# Patient Record
Sex: Female | Born: 1960 | Race: White | Hispanic: No | Marital: Married | Smoking: Never smoker
Health system: Southern US, Community
[De-identification: ages and names within clinical notes are randomized; demographics above are authoritative.]

## PROBLEM LIST (undated history)

## (undated) DIAGNOSIS — H409 Unspecified glaucoma: Secondary | ICD-10-CM

## (undated) DIAGNOSIS — R519 Headache, unspecified: Secondary | ICD-10-CM

## (undated) DIAGNOSIS — T7840XA Allergy, unspecified, initial encounter: Secondary | ICD-10-CM

## (undated) DIAGNOSIS — K5790 Diverticulosis of intestine, part unspecified, without perforation or abscess without bleeding: Secondary | ICD-10-CM

## (undated) DIAGNOSIS — I10 Essential (primary) hypertension: Secondary | ICD-10-CM

## (undated) DIAGNOSIS — C4490 Unspecified malignant neoplasm of skin, unspecified: Secondary | ICD-10-CM

## (undated) DIAGNOSIS — E669 Obesity, unspecified: Secondary | ICD-10-CM

## (undated) DIAGNOSIS — K259 Gastric ulcer, unspecified as acute or chronic, without hemorrhage or perforation: Secondary | ICD-10-CM

## (undated) HISTORY — DX: Headache, unspecified: R51.9

## (undated) HISTORY — PX: VAGINAL HYSTERECTOMY: SUR661

## (undated) HISTORY — DX: Obesity, unspecified: E66.9

## (undated) HISTORY — PX: KNEE SURGERY: SHX244

## (undated) HISTORY — DX: Unspecified malignant neoplasm of skin, unspecified: C44.90

## (undated) HISTORY — DX: Unspecified glaucoma: H40.9

## (undated) HISTORY — DX: Essential (primary) hypertension: I10

## (undated) HISTORY — DX: Hypomagnesemia: E83.42

## (undated) HISTORY — DX: Gastric ulcer, unspecified as acute or chronic, without hemorrhage or perforation: K25.9

## (undated) HISTORY — DX: Diverticulosis of intestine, part unspecified, without perforation or abscess without bleeding: K57.90

## (undated) HISTORY — DX: Allergy, unspecified, initial encounter: T78.40XA

## (undated) HISTORY — PX: SQUAMOUS CELL CARCINOMA EXCISION: SHX2433

## (undated) HISTORY — PX: SALIVARY GLAND SURGERY: SHX768

---

## 1998-05-11 ENCOUNTER — Other Ambulatory Visit: Admission: RE | Admit: 1998-05-11 | Discharge: 1998-05-11 | Payer: Self-pay | Admitting: Obstetrics and Gynecology

## 1998-05-22 ENCOUNTER — Other Ambulatory Visit: Admission: RE | Admit: 1998-05-22 | Discharge: 1998-05-22 | Payer: Self-pay | Admitting: Obstetrics and Gynecology

## 1999-12-30 ENCOUNTER — Other Ambulatory Visit: Admission: RE | Admit: 1999-12-30 | Discharge: 1999-12-30 | Payer: Self-pay | Admitting: Obstetrics and Gynecology

## 2000-11-02 ENCOUNTER — Encounter: Admission: RE | Admit: 2000-11-02 | Discharge: 2000-11-02 | Payer: Self-pay | Admitting: Internal Medicine

## 2000-11-02 ENCOUNTER — Encounter: Payer: Self-pay | Admitting: Internal Medicine

## 2002-10-21 ENCOUNTER — Encounter: Payer: Self-pay | Admitting: Emergency Medicine

## 2002-10-21 ENCOUNTER — Emergency Department (HOSPITAL_COMMUNITY): Admission: EM | Admit: 2002-10-21 | Discharge: 2002-10-21 | Payer: Self-pay | Admitting: Emergency Medicine

## 2005-02-10 ENCOUNTER — Ambulatory Visit (HOSPITAL_COMMUNITY): Admission: RE | Admit: 2005-02-10 | Discharge: 2005-02-10 | Payer: Self-pay | Admitting: Family Medicine

## 2005-05-19 ENCOUNTER — Observation Stay (HOSPITAL_COMMUNITY): Admission: RE | Admit: 2005-05-19 | Discharge: 2005-05-21 | Payer: Self-pay | Admitting: Obstetrics and Gynecology

## 2005-05-19 ENCOUNTER — Encounter (INDEPENDENT_AMBULATORY_CARE_PROVIDER_SITE_OTHER): Payer: Self-pay | Admitting: *Deleted

## 2007-03-07 HISTORY — PX: VAGINAL HYSTERECTOMY: SUR661

## 2009-04-14 ENCOUNTER — Encounter: Payer: Self-pay | Admitting: Cardiology

## 2009-06-30 ENCOUNTER — Encounter: Payer: Self-pay | Admitting: Cardiology

## 2009-07-06 DIAGNOSIS — R519 Headache, unspecified: Secondary | ICD-10-CM | POA: Insufficient documentation

## 2009-07-06 DIAGNOSIS — R0789 Other chest pain: Secondary | ICD-10-CM | POA: Insufficient documentation

## 2009-07-06 DIAGNOSIS — R51 Headache: Secondary | ICD-10-CM

## 2009-07-06 DIAGNOSIS — R002 Palpitations: Secondary | ICD-10-CM | POA: Insufficient documentation

## 2009-07-07 ENCOUNTER — Ambulatory Visit: Payer: Self-pay

## 2009-07-07 ENCOUNTER — Ambulatory Visit: Payer: Self-pay | Admitting: Cardiology

## 2009-07-08 LAB — CONVERTED CEMR LAB: TSH: 0.99 microintl units/mL (ref 0.35–5.50)

## 2009-07-27 ENCOUNTER — Ambulatory Visit (HOSPITAL_COMMUNITY): Admission: RE | Admit: 2009-07-27 | Discharge: 2009-07-27 | Payer: Self-pay | Admitting: Cardiology

## 2009-07-27 ENCOUNTER — Ambulatory Visit: Payer: Self-pay

## 2009-07-27 ENCOUNTER — Ambulatory Visit: Payer: Self-pay | Admitting: Cardiology

## 2009-07-27 ENCOUNTER — Encounter: Payer: Self-pay | Admitting: Cardiology

## 2009-08-18 ENCOUNTER — Telehealth (INDEPENDENT_AMBULATORY_CARE_PROVIDER_SITE_OTHER): Payer: Self-pay | Admitting: *Deleted

## 2009-09-02 ENCOUNTER — Ambulatory Visit: Payer: Self-pay | Admitting: Cardiology

## 2010-04-05 NOTE — Letter (Signed)
Summary: [facility] Office Note  [facility] Office Note   Imported By: Roderic Ovens 08/04/2009 11:40:03  _____________________________________________________________________  External Attachment:    Type:   Image     Comment:   External Document

## 2010-04-05 NOTE — Procedures (Signed)
Summary: Summary Report  Summary Report   Imported By: Erle Crocker 08/27/2009 09:38:31  _____________________________________________________________________  External Attachment:    Type:   Image     Comment:   External Document

## 2010-04-05 NOTE — Letter (Signed)
Summary: [facility] Office Note  [facility] Office Note   Imported By: Roderic Ovens 08/10/2009 12:08:08  _____________________________________________________________________  External Attachment:    Type:   Image     Comment:   External Document

## 2010-04-05 NOTE — Progress Notes (Signed)
  Phone Note Outgoing Call   Call placed by: Deliah Goody, RN,  August 18, 2009 4:45 PM Summary of Call: event monitor shows sinus with rare PAC Left message to call back Deliah Goody, RN  August 18, 2009 4:46 PM   Follow-up for Phone Call        returning call, [phone], [name]  August 19, 2009 11:15 AM  Deanna Choi is aware of event monitor results. Lisabeth Devoid RN

## 2010-04-05 NOTE — Assessment & Plan Note (Signed)
Summary: np6/chest tightness,cold sweats/bil hand tingling/heart palps   Primary Provider:  Dr. Andrey Campanile  CC:  pt states she has an uncomfortable feeling in chest and heart racing.  History of Present Illness: 50 year-old female for evaluation of chest pain. No prior cardiac history. She has over the past one month noticed intermittent sudden onset of chest tightness associated with palpitations. Her heart will race. The pain does not radiate. There is mild dizziness but there is no syncope. There is no associated dyspnea. The symptoms last approximately 1 minute and resolve spontaneously. They're not related to exertion. She otherwise has dyspnea with more extreme activities but not with routine activities. There is no exertional chest pain. There is no orthopnea, PND or pedal edema. Also note she exercises routinely on a treadmill. She can exercise for 50 minutes with no symptoms. Because of the above we were asked to further evaluate.  Current Medications (verified): 1)  Aleve 220 Mg Tabs (Naproxen Sodium) .... As Needed  Past History:  Past Medical History: No previous medical problems  Past Surgical History: Reviewed history from 07/06/2009 and no changes required. Total vaginal hysterectomy.  salivary gland surgery in 1969   knee surgery in 1992.  Family History: Reviewed history from 07/06/2009 and no changes required. Positive for hypertension and diabetes. Heart Disease Brother had CABG 2 tears secondary to heart disease  Social History: Reviewed history from 07/06/2009 and no changes required. The patient denies the use of tobacco Occasional ETOH Full Time Married   Review of Systems       Some fatigue but no fevers or chills, productive cough, hemoptysis, dysphasia, odynophagia, melena, hematochezia, dysuria, hematuria, rash, seizure activity, orthopnea, PND, pedal edema, claudication. Remaining systems are negative.   Vital Signs:  Patient profile:   51 year old  female Height:      66 inches Weight:      195 pounds BMI:     31.59 Pulse rate:   69 / minute Resp:     14 per minute BP sitting:   122 / 75  (left arm)  Vitals Entered By: Kem Parkinson (Jul 07, 2009 2:30 PM)  Physical Exam  General:  Well developed/well nourished in NAD Skin warm/dry Patient not depressed No peripheral clubbing Back-normal HEENT-normal/normal eyelids Neck supple/normal carotid upstroke bilaterally; no bruits; no JVD; no thyromegaly chest - CTA/ normal expansion CV - RRR/normal S1 and S2; no murmurs, rubs or gallops;  PMI nondisplaced Abdomen -NT/ND, no HSM, no mass, + bowel sounds, no bruit 2+ femoral pulses, no bruits Ext-no edema, chords, 2+ DP Neuro-grossly nonfocal     EKG  Procedure date:  06/30/2009  Findings:      Sinus rhythm at a rate of 69. No ST changes.  Impression & Recommendations:  Problem # 1:  PALPITATIONS (ICD-785.1) Check echocardiogram, TSH and CardioNet. Orders: Echocardiogram (Echo) TLB-TSH (Thyroid Stimulating Hormone) (84443-TSH) Event (Event)  Problem # 2:  CHEST DISCOMFORT (ICD-786.59) Symptoms only with palpitations but strong family history. Schedule stress echocardiogram for risk stratification. Orders: Stress Echo (Stress Echo)  Patient Instructions: 1)  Your physician recommends that you schedule a follow-up appointment in: 6 WEEKS 2)  Your physician has requested that you have a stress echocardiogram. For further information please visit [email or web address]  Please follow instruction sheet as given. 3)  Your physician has requested that you have an echocardiogram.  Echocardiography is a painless test that uses sound waves to create images of your heart. It provides your doctor with information about  the size and shape of your heart and how well your heart's chambers and valves are working.  This procedure takes approximately one hour. There are no restrictions for this procedure. 4)  Your physician has  recommended that you wear an event monitor.  Event monitors are medical devices that record the heart's electrical activity. Doctors most often use these monitors to diagnose arrhythmias. Arrhythmias are problems with the speed or rhythm of the heartbeat. The monitor is a small, portable device. You can wear one while you do your normal daily activities. This is usually used to diagnose what is causing palpitations/syncope (passing out).

## 2010-04-05 NOTE — Assessment & Plan Note (Signed)
Summary: PER CHECK OUT/SF   Primary Provider:  Dr. Andrey Campanile  CC:  follow up.  History of Present Illness: 50 year old female I saw in May of 2011 for chest pain and palpitations. An echocardiogram in May 2011 revealed normal LV function, trivial aortic insufficiency and mild mitral regurgitation. A stress echocardiogram was normal. A TSH was normal. A CardioNet revealed  sinus rhythm with PACs. Since then,  she continues to have occasional brief palpitations that are not sustained. They're not associated with dyspnea or chest pain and there is no syncope. She otherwise denies any dyspnea on exertion, orthopnea, PND or exertional chest pain.  Current Medications (verified): 1)  Aleve 220 Mg Tabs (Naproxen Sodium) .... As Needed  Past History:  Past Medical History: Reviewed history from 07/07/2009 and no changes required. No previous medical problems  Past Surgical History: Reviewed history from 07/06/2009 and no changes required. Total vaginal hysterectomy.  salivary gland surgery in 1969   knee surgery in 1992.  Social History: Reviewed history from 07/07/2009 and no changes required. The patient denies the use of tobacco Occasional ETOH Full Time Married   Review of Systems       no fevers or chills, productive cough, hemoptysis, dysphasia, odynophagia, melena, hematochezia, dysuria, hematuria, rash, seizure activity, orthopnea, PND, pedal edema, claudication. Remaining systems are negative.   Vital Signs:  Patient profile:   50 year old female Height:      66 inches Weight:      195 pounds BMI:     31.59 Pulse rate:   68 / minute Resp:     14 per minute BP sitting:   110 / 74  (left arm)  Vitals Entered By: Kem Parkinson (September 02, 2009 8:52 AM)  Physical Exam  General:  Well-developed well-nourished in no acute distress.  Skin is warm and dry.  HEENT is normal.  Neck is supple. No thyromegaly.  Chest is clear to auscultation with normal expansion.    Cardiovascular exam is regular rate and rhythm.  Abdominal exam nontender or distended. No masses palpated. Extremities show no edema. neuro grossly intact    Impression & Recommendations:  Problem # 1:  PALPITATIONS (ICD-785.1) Only arrhythmia identified his PACs. I explained that if her symptoms worsen in the future we could add a beta blocker. Also note she did not have significant symptoms while the monitor was in place. If her symptoms worsen in the future we could repeat her monitor to see if we can identify an arrhythmia.  Problem # 2:  CHEST DISCOMFORT (ICD-786.59) No further symptoms. Stress echocardiogram normal. No further workup.

## 2010-04-05 NOTE — Letter (Signed)
Summary: [facility] Office Note  [facility] Office Note   Imported By: Roderic Ovens 08/04/2009 11:40:47  _____________________________________________________________________  External Attachment:    Type:   Image     Comment:   External Document

## 2010-07-22 NOTE — H&P (Signed)
NAME:  Deanna Choi, Deanna Choi           ACCOUNT NO.:  [id number]   MEDICAL RECORD NO.:  [id number]          PATIENT TYPE:  AMB   LOCATION:  SDC                           FACILITY:  [facility]   PHYSICIAN:  James A. Ashley Royalty, M.D.DATE OF BIRTH:  Aug 04, 1960   DATE OF ADMISSION:  DATE OF DISCHARGE:                                HISTORY & PHYSICAL   This is a 50 year old gravida 1, para 1 who presented to me December 2006  complaining of abnormal uterine bleeding for approximately 1 year duration.  She stated the desire for hysterectomy. Upon further questioning, she stated  she had urinary incontinence. A portion of it seemed to be related to  coughing, sneezing, laughing, etc. However, another portion of it appeared  to basically involve urinary urgency. The patient had an ultrasound  performed at [facility] February 10, 2005. The ultrasound revealed the  uterus to be approximately 8.7 x 4.3 x 5.8 cm. There were several small  fibroids present in the 1-2 cm range. The adnexa were unremarkable  bilaterally. The patient was subsequently referred to Dr. Ihor Gully in  urology. 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 He  subsequently placed her on Oxytrol patch as well as Vesicare. She has had  excellent results from those products, especially the Vesicare. She states  and he concurs that she no longer needs any surgical procedure for stress  incontinence at this time as her incontinence is quite well managed by the  medical approach. The patient had an endometrial biopsy March 20, 2005,  which revealed no evidence of hyperplasia or carcinoma. She was given Megace  to assist in control of the abnormal uterine bleeding. It did improve the  abnormal uterine bleeding but not sufficiently to dissuade her from surgical  extirpation. The patient states she continues to want to have definitive  therapy in the form  of a hysterectomy and is admitted for same.   MEDICATIONS:  As above.   PAST MEDICAL HISTORY:  Medical:  Migraine headaches. Surgical:  The patient  had salivary gland surgery in 1969 and knee surgery in 1992.   ALLERGIES:  None.   FAMILY HISTORY:  Positive for hypertension and diabetes.   SOCIAL HISTORY:  The patient denies the use of tobacco or significant  alcohol.   REVIEW OF SYSTEMS:  Noncontributory.   PHYSICAL EXAMINATION:  GENERAL:  Well-developed, well-nourished, pleasant  white female in no acute distress.  VITAL SIGNS:  Afebrile, vital signs stable.  SKIN:  Warm and dry without lesions.  LYMPH:  There is no supraclavicular, cervical, or inguinal adenopathy.  HEENT:  Normocephalic.  NECK:  Supple without thyromegaly.  CHEST:  Lungs are clear.  CARDIAC:  Regular rate and rhythm.  ABDOMEN:  Soft and nontender without masses or organomegaly. Bowel sounds  are active.  MUSCULOSKELETAL:  Reveals full range of motion without edema, cyanosis, or  CVA tenderness.  PELVIC:  External genitalia within normal limits. Vagina and cervix were  without gross lesions. Bimanual examination reveals the uterus to be  approximately 8 x  4 x 4 cm and no adnexal masses are palpable.   IMPRESSION:  1.  Fibroid uterus.  2.  Abnormal uterine bleeding - refractory to medical management.  3.  Urinary incontinence - mostly urge incontinence after evaluation.  4.  Migraine headaches.   PLAN:  Total vaginal hysterectomy. Risks, benefits, complications, and  alternatives fully discussed with the patient. The possibility of unilateral  or bilateral salpingo-oophorectomy discussed and accepted. The possibility  of exploratory laparotomy with total abdominal hysterectomy discussed and  accepted. Questions invited and answered.   Please note, a portion of the evaluation for this document was performed in  the outpatient setting.      James A. Ashley Royalty, M.D.  Electronically Signed      JAM/MEDQ  D:  05/18/2005  T:  05/18/2005  Job:  [id number]

## 2010-07-22 NOTE — Discharge Summary (Signed)
NAME:  Deanna Choi, Deanna Choi           ACCOUNT NO.:  [id number]   MEDICAL RECORD NO.:  [id number]          PATIENT TYPE:  INP   LOCATION:  9317                          FACILITY:  [facility]   PHYSICIAN:  Rudy Jew. Ashley Royalty, M.D.DATE OF BIRTH:  [birth year]   DATE OF ADMISSION:  05/19/2005  DATE OF DISCHARGE:  05/21/2005                                 DISCHARGE SUMMARY   DISCHARGE DIAGNOSES:  1.  Fibroid uterus.  2.  Abnormal uterine bleeding, refractory to medical management.   PROCEDURE:  Total vaginal hysterectomy.   CONSULTATIONS:  None.   DISCHARGE MEDICATIONS:  1.  Motrin.  2.  Percocet.   HISTORY OF PRESENT ILLNESS:  This is a 50 year old, G1, P1 with a fibroid  uterus and abnormal uterine bleeding refractory to medical management.  The  patient was admitted for vaginal hysterectomy.  For the remainder of the  History and Physical, please see the chart.   HOSPITAL COURSE:  The patient was admitted to [facility].  Admission laboratory studies were performed.  On May 19, 2005, she was  taken to the operating room and underwent total vaginal hysterectomy by Dr.  Frederich Cha.  The procedure was uncomplicated.  The patient's postop  course was benign, afebrile in satisfactory condition.   The pathology report revealed multiple benign leiomyomatas.  There was no  cervical neoplasia or endometrial neoplasia.   DISPOSITION:  The patient was to return [facility]  in approximately 6 weeks for postop evaluation.      James A. Ashley Royalty, M.D.  Electronically Signed     JAM/MEDQ  D:  06/08/2005  T:  06/09/2005  Job:  [id number]

## 2010-07-22 NOTE — Op Note (Signed)
NAME:  Deanna Choi, Deanna Choi           ACCOUNT NO.:  [id number]   MEDICAL RECORD NO.:  [id number]          PATIENT TYPE:  OBV   LOCATION:  9317                          FACILITY:  [facility]   PHYSICIAN:  Rudy Jew. Ashley Royalty, M.D.DATE OF BIRTH:  Feb 26, 1961   DATE OF PROCEDURE:  05/19/2005  DATE OF DISCHARGE:                                 OPERATIVE REPORT   PREOPERATIVE DIAGNOSES:  1.  Fibroid uterus.  2.  Abnormal uterine bleeding, refractory to medical management.   POSTOPERATIVE DIAGNOSES:  1.  Fibroid uterus.  2.  Abnormal uterine bleeding, refractory to medical management.  3.  Pathology pending.   PROCEDURE:  Total vaginal hysterectomy.   SURGEON:  Rudy Jew. Ashley Royalty, M.D.   ASSISTANT:  Bing Neighbors. Sydnee Cabal, M.D.   ANESTHESIA:  General.   ESTIMATED BLOOD LOSS:  50 mL.   COMPLICATIONS:  None.   PACKS AND DRAINS:  None.   PROCEDURE:  The patient was taken to the operating room and placed in the  dorsal supine position.  After a general anesthetic was administered., she  was placed in the lithotomy position and prepped and draped in the usual  manner for vaginal surgery.  A Foley catheter was placed.  A posterior  weighted retractor was placed per vagina.  The anterior lip of the cervix  was grasped with a Christella Hartigan tenaculum.  Approximately 20 mL of 1% Xylocaine  with 1:100,000 epinephrine were instilled circumferentially to aid in  hemostasis.  The cervix was then circumscribed with a knife.  A posterior  colpotomy incision was made successfully.  The Steiner-Auvard retractor was  placed posteriorly.  Next, uterosacral ligaments were clamped, cut and  secured with #1 chromic catgut.  Cardinal ligaments were bilaterally  clamped, cut and secured with #1 chromic catgut.  The anterior cul-de-sac  was successfully entered.  The uterine vessels were then clamped, cut and  doubly secured with #1 chromic catgut.  Additional bites were taken on  either side of the uterus toward the  adnexa.  Each pedicle was clamped, cut  and secured with #1 chromic catgut.  Next the corpus was delivered by  placing a single-tooth tenaculum on its posterior aspect.  The adnexal  pedicles consisting of the round, utero-ovarian anastomosis and fallopian  tubes were clamped, cut and doubly secured bilaterally, thus allowing  delivery of the specimen.  All pedicles were inspected and found to be  hemostatic.  A tail sponge was placed.  A shorter posterior retractor was  placed.  The posterior vaginal cuff was then secured hemostatically with a  running locking suture of 2-0 Vicryl.  Hemostasis was noted.  At this point  the operative field was once again carefully inspected and noted to be  hemostatic.  The adnexal pedicles were released after confirming normal  anatomy of the ovaries, fallopian tubes and total hemostasis.  Next, the  vagina was closed anterior to posterior using interrupted sutures of 0  chromic catgut.  Copious irrigation was accomplished.  Hemostasis was noted.  After complete closure, the uterosacral pedicles were tied midline to create  a high plication of the vaginal cuff.  Hemostasis was noted, and the  procedure was terminated.   At the conclusion of procedure, the urine was clear and copious.  The  patient was then taken to the recovery room in excellent condition.      James A. Ashley Royalty, M.D.  Electronically Signed     JAM/MEDQ  D:  05/19/2005  T:  05/20/2005  Job:  [id number]

## 2011-11-08 ENCOUNTER — Encounter: Payer: Self-pay | Admitting: Cardiology

## 2013-01-23 ENCOUNTER — Other Ambulatory Visit: Payer: Self-pay | Admitting: Dermatology

## 2013-04-25 ENCOUNTER — Emergency Department (HOSPITAL_COMMUNITY): Payer: BC Managed Care – PPO

## 2013-04-25 ENCOUNTER — Encounter (HOSPITAL_COMMUNITY): Payer: Self-pay | Admitting: Emergency Medicine

## 2013-04-25 ENCOUNTER — Emergency Department (HOSPITAL_COMMUNITY)
Admission: EM | Admit: 2013-04-25 | Discharge: 2013-04-25 | Disposition: A | Discharge status: Home / Self Care | Payer: BC Managed Care – PPO | Attending: Emergency Medicine | Admitting: Emergency Medicine

## 2013-04-25 DIAGNOSIS — J019 Acute sinusitis, unspecified: Secondary | ICD-10-CM | POA: Insufficient documentation

## 2013-04-25 DIAGNOSIS — R112 Nausea with vomiting, unspecified: Secondary | ICD-10-CM | POA: Insufficient documentation

## 2013-04-25 DIAGNOSIS — Z8679 Personal history of other diseases of the circulatory system: Secondary | ICD-10-CM | POA: Insufficient documentation

## 2013-04-25 DIAGNOSIS — H53149 Visual discomfort, unspecified: Secondary | ICD-10-CM | POA: Insufficient documentation

## 2013-04-25 MED ORDER — DIPHENHYDRAMINE HCL 50 MG/ML IJ SOLN
25.0000 mg | Freq: Once | INTRAMUSCULAR | Status: AC
  Administered 2013-04-25: 25 mg via INTRAVENOUS
  Filled 2013-04-25: qty 1

## 2013-04-25 MED ORDER — METOCLOPRAMIDE HCL 5 MG/ML IJ SOLN
10.0000 mg | Freq: Once | INTRAMUSCULAR | Status: AC
  Administered 2013-04-25: 10 mg via INTRAVENOUS
  Filled 2013-04-25: qty 2

## 2013-04-25 MED ORDER — AMOXICILLIN-POT CLAVULANATE 875-125 MG PO TABS: 1.0000 | ORAL_TABLET | Freq: Two times a day (BID) | ORAL | Status: DC

## 2013-04-25 MED ORDER — DEXAMETHASONE 6 MG PO TABS
10.0000 mg | ORAL_TABLET | Freq: Once | ORAL | Status: AC
  Administered 2013-04-25: 10 mg via ORAL
  Filled 2013-04-25: qty 1

## 2013-04-25 MED ORDER — DEXAMETHASONE SODIUM PHOSPHATE 10 MG/ML IJ SOLN
10.0000 mg | Freq: Once | INTRAMUSCULAR | Status: DC
Start: 2013-04-25 — End: 2013-04-25

## 2013-04-25 MED ORDER — SODIUM CHLORIDE 0.9 % IV BOLUS (SEPSIS)
1000.0000 mL | Freq: Once | INTRAVENOUS | Status: AC
  Administered 2013-04-25: 1000 mL via INTRAVENOUS

## 2013-04-25 MED ORDER — DEXAMETHASONE 1 MG/ML PO CONC: 10.0000 mg | Freq: Once | ORAL | Status: DC

## 2013-04-25 NOTE — ED Notes (Signed)
Pt escorted to discharge window. Verbalized understanding discharge instructions. In no acute distress.   

## 2013-04-25 NOTE — ED Notes (Signed)
Pt states she has had cold sxs since Tuesday  Pt states yesterday she developed a headache that has progressively gotten worse  Pt states she his sensitive to light and has nausea and vomiting for the headache

## 2013-04-25 NOTE — ED Provider Notes (Signed)
Medical screening examination/treatment/procedure(s) were performed by non-physician practitioner and as supervising physician I was immediately available for consultation/collaboration.  EKG Interpretation   None         Delice Bison Tashona Calk, DO 04/25/13 1534

## 2013-04-25 NOTE — Discharge Instructions (Signed)
Take antibiotic for full dose  Drink plenty of fluids, dry warm compresses, humidifier, and Tylenol/Ibuprofen if needed  Return to the emergency department if you develop any changing/worsening condition, fever, weakness, vision changes, repeated vomiting, confusion, slurred speech, difficulty walking, or any other concerns (please read additional information regarding your condition below)   Sinusitis Sinusitis is redness, soreness, and swelling (inflammation) of the paranasal sinuses. Paranasal sinuses are air pockets within the bones of your face (beneath the eyes, the middle of the forehead, or above the eyes). In healthy paranasal sinuses, mucus is able to drain out, and air is able to circulate through them by way of your nose. However, when your paranasal sinuses are inflamed, mucus and air can become trapped. This can allow bacteria and other germs to grow and cause infection. Sinusitis can develop quickly and last only a short time (acute) or continue over a long period (chronic). Sinusitis that lasts for more than 12 weeks is considered chronic.  CAUSES  Causes of sinusitis include:  Allergies.  Structural abnormalities, such as displacement of the cartilage that separates your nostrils (deviated septum), which can decrease the air flow through your nose and sinuses and affect sinus drainage.  Functional abnormalities, such as when the small hairs (cilia) that line your sinuses and help remove mucus do not work properly or are not present. SYMPTOMS  Symptoms of acute and chronic sinusitis are the same. The primary symptoms are pain and pressure around the affected sinuses. Other symptoms include:  Upper toothache.  Earache.  Headache.  Bad breath.  Decreased sense of smell and taste.  A cough, which worsens when you are lying flat.  Fatigue.  Fever.  Thick drainage from your nose, which often is green and may contain pus (purulent).  Swelling and warmth over the  affected sinuses. DIAGNOSIS  Your caregiver will perform a physical exam. During the exam, your caregiver may:  Look in your nose for signs of abnormal growths in your nostrils (nasal polyps).  Tap over the affected sinus to check for signs of infection.  View the inside of your sinuses (endoscopy) with a special imaging device with a light attached (endoscope), which is inserted into your sinuses. If your caregiver suspects that you have chronic sinusitis, one or more of the following tests may be recommended:  Allergy tests.  Nasal culture A sample of mucus is taken from your nose and sent to a lab and screened for bacteria.  Nasal cytology A sample of mucus is taken from your nose and examined by your caregiver to determine if your sinusitis is related to an allergy. TREATMENT  Most cases of acute sinusitis are related to a viral infection and will resolve on their own within 10 days. Sometimes medicines are prescribed to help relieve symptoms (pain medicine, decongestants, nasal steroid sprays, or saline sprays).  However, for sinusitis related to a bacterial infection, your caregiver will prescribe antibiotic medicines. These are medicines that will help kill the bacteria causing the infection.  Rarely, sinusitis is caused by a fungal infection. In theses cases, your caregiver will prescribe antifungal medicine. For some cases of chronic sinusitis, surgery is needed. Generally, these are cases in which sinusitis recurs more than 3 times per year, despite other treatments. HOME CARE INSTRUCTIONS   Drink plenty of water. Water helps thin the mucus so your sinuses can drain more easily.  Use a humidifier.  Inhale steam 3 to 4 times a day (for example, sit in the bathroom with the  shower running).  Apply a warm, moist washcloth to your face 3 to 4 times a day, or as directed by your caregiver.  Use saline nasal sprays to help moisten and clean your sinuses.  Take over-the-counter or  prescription medicines for pain, discomfort, or fever only as directed by your caregiver. SEEK IMMEDIATE MEDICAL CARE IF:  You have increasing pain or severe headaches.  You have nausea, vomiting, or drowsiness.  You have swelling around your face.  You have vision problems.  You have a stiff neck.  You have difficulty breathing. MAKE SURE YOU:   Understand these instructions.  Will watch your condition.  Will get help right away if you are not doing well or get worse. Document Released: 02/20/2005 Document Revised: 05/15/2011 Document Reviewed: 03/07/2011 [facility] Patient Information 2014 [place], [place].   Emergency Department Resource Guide 1) Find a Doctor and Pay Out of Pocket Although you won't have to find out who is covered by your insurance plan, it is a good idea to ask around and get recommendations. You will then need to call the office and see if the doctor you have chosen will accept you as a new patient and what types of options they offer for patients who are self-pay. Some doctors offer discounts or will set up payment plans for their patients who do not have insurance, but you will need to ask so you aren't surprised when you get to your appointment.  2) Contact Your Local Health Department Not all health departments have doctors that can see patients for sick visits, but many do, so it is worth a call to see if yours does. If you don't know where your local health department is, you can check in your phone book. The CDC also has a tool to help you locate your state's health department, and many state websites also have listings of all of their local health departments.  3) Find a Walk-in Clinic If your illness is not likely to be very severe or complicated, you may want to try a walk in clinic. These are popping up all over the country in pharmacies, drugstores, and shopping centers. They're usually staffed by nurse practitioners or physician assistants that have  been trained to treat common illnesses and complaints. They're usually fairly quick and inexpensive. However, if you have serious medical issues or chronic medical problems, these are probably not your best option.  No Primary Care Doctor: - Call Health Connect at  [phone] - they can help you locate a primary care doctor that  accepts your insurance, provides certain services, etc. - Physician Referral Service- [phone]  Chronic Pain Problems: Organization         Address  Phone   Notes  [facility]  [phone] Patients need to be referred by their primary care doctor.   Medication Assistance: Organization         Address  Phone   Notes  [facility] Medication [facility] [address] [place] [place], [place] [address] [phone] --Must be a resident of [facility] -- Must have NO insurance coverage whatsoever (no Medicaid/ Medicare, etc.) -- The pt. MUST have a primary care doctor that directs their care regularly and follows them in the community   Med[facility]ist  [phone]   [employer]  [phone]    Agencies that provide inexpensive medical care: Organization         Address  Phone   Notes  [facility]  Family Medicine  [phone]   [facility] Internal Medicine    [phone]   [facility] [place], [place] [address] [phone]   [place] [address], [place] [phone]   Planned Parenthood    [phone]   [facility]    [phone]   [place] and [place] [address], [place] Phone:  [phone], Fax:  [phone] Hours of Operation:  9 am - 6 pm, M-F.  Also accepts Medicaid/Medicare and self-pay.  [facility] for [place], [address], [place] Phone: [phone], Fax: [phone]. Hours of Operation:  8:30 am - 5:30 pm, M-F.  Also accepts Medicaid and  self-pay.  [facility] [place] [address] [place] Phone: [phone]   [place], [place], [place] [phone], Ext. [phone] Mondays & Thursdays: 7-9 AM.  First 15 patients are seen on a first come, first serve basis.    [place] Providers:  Organization         Address  Phone   Notes  [facility] [address], [place] [phone] Also accepts self-pay patients.  [facility] [address], [place]  [phone]   [place], [address], [place] [phone]   [facility] [address], [place] [phone]   Lucianne Lei [address], [place]   [phone] Only accepts [place] Access [place] patients after they have their name applied to their card.   Self-Pay (no insurance) in [facility]:  Organization         Address  Phone   Notes  Sickle Cell Patients, [facility] Internal [facility] [place] [phone]   [facility] [place] [phone]   [facility] [place]  [place], [place], [place] [phone]   [facility]/Dr. Osei-Bonsu  [address] [place] or [address], [place] [phone] Phone number for both [place] and [place] locations is the same.  [facility] [address] [place] [phone]   [facility] [address], [place] or [address] [phone]   [facility] [address], [place] [phone], phone; [phone], fax Sees patients 1st and 3rd Saturday of every month.  Must not qualify for public or private insurance (i.e. Medicaid, Medicare, [place] Health Choice, Veterans' Benefits)  Household income  should be no more than 200% of the poverty level The clinic cannot treat you if you are pregnant or think you are pregnant  Sexually transmitted diseases are not treated at the clinic.    Dental Care: Organization         Address  Phone  Notes  [facility] of [facility] [place] [phone] Accepts children up to age 39 who are enrolled in [place] or [place]; pregnant women with a Medicaid card; and children who have applied for Medicaid or [place] Health Choice, but were declined, whose parents can pay a reduced fee at time of service.  [place]  [place]  [address], [place] [phone] Accepts children up to age 24 who are enrolled in Medicaid or [place]; pregnant women with a Medicaid card; and children who have applied for Medicaid or [place] Health Choice, but were declined, whose parents can pay a reduced fee at time of service.  [facility] Adult Dental Access PROGRAM  [place] [phone] Patients are seen by appointment only. Walk-ins are not accepted. [place] will see patients 6 years of age and older. Monday - Tuesday (8am-5pm) Most Wednesdays (8:30-5pm) $30 per visit, cash only  [facility] Adult Dental Access PROGRAM  [address], [facility] [phone] Patients are seen by appointment only. Walk-ins are not accepted. [place] will see patients 56 years of age and older. One Wednesday Evening (Monthly: Volunteer Based).  $30 per visit, cash only  [place]  [phone] for adults; Children under age 79, call Graduate Pediatric Dentistry at [phone]. Children aged 70-14, please call [phone] to request a pediatric application.  Dental services are provided in all areas of dental care including fillings, crowns and bridges, complete and partial dentures, implants, gum treatment,  root canals, and extractions. Preventive care is also provided. Treatment is provided to both adults and children. Patients are selected via a lottery and there is often a waiting list.   [facility] [address] [place]  [phone] [email or web address]   [facility] [address] [place], [place] [phone], Ext. [phone] Second and Fourth Thursday of each month, opens at 6:30 AM; Clinic ends at 9 AM.  Patients are seen on a first-come first-served basis, and a limited number are seen during each clinic.   [facility]  [address] Hillard Danker [place], [place] [phone]   Eligibility Requirements You must have lived in [place], [place], or [place] counties for at least the last three months.   You cannot be eligible for state or federal sponsored [employer], including [employer], [place], or [employer].   You generally cannot be eligible for healthcare insurance through your employer.    How to apply: Eligibility screenings are held every Tuesday and Wednesday afternoon from 1:00 pm until 4:00 pm. You do not need an appointment for the interview!  [facility] [address] [place], [place]   [place]  [place] Department  [place]  [phone]    Behavioral Health Resources in the Community: Intensive Outpatient Programs Organization         Address  Phone  Notes  [facility] [address] [place], [place] [phone]   [facility] Outpatient [address], [place], [place]   ADS: [facility] Drug [facility] [address], [place], [place]   [place] [address],  [place], [place] or [phone]   Substance Abuse Resources Organization         Address  Phone  Notes  Alcohol and Drug Services   [phone]   [place]  [phone]   [facility] [place]   Chinita Pester  [phone]   Residential & Outpatient Substance Abuse Program  [phone]   Psychological Services Organization         Address  Phone  Notes   Health  [phone]  [phone]   [facility] Services  [phone]   [facility] [address] [place] [phone] or [phone]    Mobile Crisis Teams Organization         Address  Phone  Notes  [facility], Mobile Crisis Care Unit  [phone]   [facility]  [address] [place], [place] [phone]   Doristine Locks [address] [place] [phone]    Self-Help/Support Groups Organization         Address  Phone             Notes  [facility] [place] - variety of support groups  336- I7437963 Call for more information  Narcotics Anonymous (NA), [facility] [address], [employer] [place]  2 meetings at this location   Statistician         Address  Phone  Notes  [facility] [address],    [place]  [phone]   [facility]  [address], [place] [address], [place], [place] [phone]   [facility] [address] [place], [place] [phone] Admissions: 8am-3pm M-F  [facility] [address]    [place], [place] [phone]   [facility] [address] [place], [place], [place] [phone]   [facility] [address]  [place], [place] [phone]   [facility] - Intensive Outpatient [address], [place], [place] [phone]   [facility] (Add[facility] [address] [place]  [place], [place] [phone] or [phone]   [facility] ([facility]) [address] [place], [place] [phone] Accepts Medicaid  [facility] [place] [address],  [place] [phone] Substance Abuse/Addiction Treatment   [facility] Organization         Address  Phone  Notes  [facility]  [phone]   Angie Fava, PhD [address] Ervin Knack [place], [place]   [phone] or [phone]   [facility] Behavioral   [address] [place], [place] [phone]   [facility] [address], [place], [place] [phone] Insurance/Medicaid/sponsorship through [facility] and Families [address]                                    [place], [place] [phone] Therapy/tele-psych/case  [facility] [address], [place] [phone]    Dr. Lolly Mustache  [phone]   [facility]  [employer] [facility] Dept. 1) [address] [place] 2) [address] [place] 3)  [address], [place] [phone]  [phone]   [facility] Child Abuse Hotline 301-073-4115 or [phone] (After Hours)

## 2013-04-25 NOTE — ED Provider Notes (Signed)
CSN: [id number]     Arrival date & time 04/25/13  0622 History   None    Chief Complaint  Patient presents with  . URI  . Migraine    HPI   Deanna Choi is a 53 y.o. female with no PMH who presents to the ED for evaluation of URI and migraine. History was provided by the patient. Patient states that she has had cold-like symptoms for the past 4 days. She describes nasal congestion, rhinorrhea, sinus pressure, non-productive cough and headache. Her headache gradually started 4 days ago and has progressed to a severe constant 10/10 generalized headache throughout. Photophobia with no vision changes. She has tried several OTC medications with no relief. She has had headaches in the past which are not similar to her headache today. She states she may have migraines but is not sure. Denies this being the worst headache of her life. She also has had nausea with a few episodes of emesis due to her severe headache. No abdominal pain, diarrhea, or constipation. She denies any fever, stiff neck, dizziness, lightheadedness, weakness, loss of sensation, numbness/tingling, confusion, slurred speech, or ataxia.    History reviewed. No pertinent past medical history. Past Surgical History  Procedure Laterality Date  . Vaginal hysterectomy    . Salivary gland surgery    . Knee surgery     Family History  Problem Relation Age of Onset  . Hypertension    . Heart disease Brother     CABG- secondary to heart disease  . Hypertension Mother   . Hypertension Father   . Stroke Father    History  Substance Use Topics  . Smoking status: Never Smoker   . Smokeless tobacco: Not on file  . Alcohol Use: Yes     Comment: rare   OB History   Grav Para Term Preterm Abortions TAB SAB Ect Mult Living                 Review of Systems  Constitutional: Negative for fever, chills, diaphoresis, activity change, appetite change and fatigue.  HENT: Positive for congestion, rhinorrhea and sinus pressure.  Negative for ear pain, sore throat and trouble swallowing.   Eyes: Positive for photophobia. Negative for pain and visual disturbance.  Respiratory: Negative for cough and shortness of breath.   Cardiovascular: Negative for chest pain and leg swelling.  Gastrointestinal: Positive for nausea and vomiting. Negative for abdominal pain, diarrhea and constipation.  Genitourinary: Negative for dysuria.  Musculoskeletal: Negative for back pain, neck pain and neck stiffness.  Skin: Negative for rash.  Neurological: Positive for headaches. Negative for dizziness, syncope, facial asymmetry, speech difficulty, weakness, light-headedness and numbness.    Allergies  Review of patient's allergies indicates no known allergies.  Home Medications   Current Outpatient Rx  Name  Route  Sig  Dispense  Refill  . DM-Doxylamine-Acetaminophen (VICKS NYQUIL COLD & FLU NIGHT) 15-6.25-325 MG CAPS   Oral   Take 1 capsule by mouth at bedtime as needed (cold/flu symptoms).         . Pseudoeph-Doxylamine-DM-APAP (DAYQUIL/NYQUIL COLD/FLU RELIEF PO)   Oral   Take 1 tablet by mouth every 6 (six) hours as needed (cold/flu symptoms).         . pseudoephedrine-acetaminophen (TYLENOL SINUS) 30-500 MG TABS   Oral   Take 1 tablet by mouth every 4 (four) hours as needed.          BP 149/86  Pulse 82  Temp(Src) 98 F (36.7 C) (  Oral)  Resp 16  Ht 5\' 6"  (1.676 m)  Wt 200 lb (90.719 kg)  BMI 32.30 kg/m2  SpO2 96%  Filed Vitals:   04/25/13 0627 04/25/13 0831 04/25/13 0939  BP: 149/86 127/87 125/85  Pulse: 82 69 67  Temp: 98 F (36.7 C) 97.8 F (36.6 C)   TempSrc: Oral Oral   Resp: 16 17 17   Height: 5\' 6"  (1.676 m)    Weight: 200 lb (90.719 kg)    SpO2: 96% 98% 100%    Physical Exam  Nursing note and vitals reviewed. Constitutional: She is oriented to person, place, and time. She appears well-developed and well-nourished. No distress.  Tearful  HENT:  Head: Normocephalic and atraumatic.  Right  Ear: External ear normal.  Left Ear: External ear normal.  Nose: Nose normal.  Mouth/Throat: Oropharynx is clear and moist. No oropharyngeal exudate.  Tenderness to palpation to the scalp and sinuses throughout with no focal tenderness. Nasal congestion and rhinorrhea. Tympanic membranes gray and translucent bilaterally with no erythema, edema, or hemotympanum. No erythema to the posterior pharynx. Tonsils without edema or exudates. Uvula midline. No trismus. No difficulty controlling secretions.   Eyes: Conjunctivae and EOM are normal. Pupils are equal, round, and reactive to light. Right eye exhibits no discharge. Left eye exhibits no discharge.  Neck: Normal range of motion. Neck supple.  No rigidity  Cardiovascular: Normal rate, regular rhythm, normal heart sounds and intact distal pulses.  Exam reveals no gallop and no friction rub.   No murmur heard. Pulmonary/Chest: Effort normal and breath sounds normal. No respiratory distress. She has no wheezes. She has no rales. She exhibits no tenderness.  Abdominal: Soft. She exhibits no distension and no mass. There is no tenderness. There is no rebound and no guarding.  Musculoskeletal: Normal range of motion. She exhibits no edema and no tenderness.  Strength 5/5 in the upper and lower extremities bilaterally. Patient able to ambulate without difficulty or ataxia  Neurological: She is alert and oriented to person, place, and time.  GCS 15.  No focal neurological deficits.  CN 2-12 intact.  No pronator drift.  Finger to nose intact.  Heel to shin intact.    Skin: Skin is warm and dry. She is not diaphoretic.    ED Course  Procedures (including critical care time) Labs Review Labs Reviewed - No data to display Imaging Review No results found.  EKG Interpretation   None            CT Head Wo Contrast (Final result)  Result time: 04/25/13 08:00:19    Final result by Rad Results In Interface (04/25/13 08:00:19)    Narrative:    CLINICAL DATA: 53 year old female with headache, congestion. Symptoms increasing. Initial encounter.  EXAM: CT HEAD WITHOUT CONTRAST  TECHNIQUE: Contiguous axial images were obtained from the base of the skull through the vertex without intravenous contrast.  COMPARISON: None.  FINDINGS: Trace fluid levels in the maxillary sinuses. Ethmoid sinus opacification. Other Visualized paranasal sinuses and mastoids are clear.  No acute osseous abnormality identified. Visualized orbits and scalp soft tissues are within normal limits.  Cerebral volume is within normal limits for age. No midline shift, ventriculomegaly, mass effect, evidence of mass lesion, intracranial hemorrhage or evidence of cortically based acute infarction. Gray-white matter differentiation is within normal limits throughout the brain. No suspicious intracranial vascular hyperdensity.  IMPRESSION: 1. Normal noncontrast CT appearance of the brain. 2. Maxillary and ethmoid sinus inflammatory changes, with small fluid levels suggesting acute  sinusitis.   Electronically Signed By: Lars Pinks M.D. On: 04/25/2013 08:00    MDM   BEATRIX BREECE is a 53 y.o. female with no PMH who presents to the ED for evaluation of URI and migraine.   Rechecks  9:30 AM = Headache resolved. Patient states she feels much better.    Etiology of headache likely due to acute sinusitis. Patient's headache resolved in the ED with IV fluids, Reglan, and benadryl. No neurological deficits. CT negative for an acute intracranial process, however, showed likely acute sinusitis (likely due to URI). Patient instructed to follow-up with PCP. Instructed to rest and drink fluids. Prescribed Augmentin. Return precautions, discharge instructions, and follow-up was discussed with the patient before discharge.      Discharge Medication List as of 04/25/2013  9:55 AM    START taking these medications   Details  amoxicillin-clavulanate (AUGMENTIN)  875-125 MG per tablet Take 1 tablet by mouth every 12 (twelve) hours., Starting 04/25/2013, Until Discontinued, Print         Final impressions: 1. Acute sinusitis     Mercy Moore PA-C   This patient was discussed with Dr. Kathlee Nations, PA-C 04/25/13 1517

## 2013-04-25 NOTE — ED Notes (Signed)
PA at bedside.

## 2013-04-25 NOTE — ED Notes (Signed)
Patient transported to CT 

## 2014-02-06 ENCOUNTER — Other Ambulatory Visit: Payer: Self-pay | Admitting: Dermatology

## 2014-04-29 ENCOUNTER — Other Ambulatory Visit: Payer: Self-pay | Admitting: Dermatology

## 2017-07-24 ENCOUNTER — Emergency Department (HOSPITAL_COMMUNITY): Payer: Managed Care, Other (non HMO)

## 2017-07-24 ENCOUNTER — Other Ambulatory Visit: Payer: Self-pay

## 2017-07-24 ENCOUNTER — Encounter (HOSPITAL_COMMUNITY): Payer: Self-pay | Admitting: Emergency Medicine

## 2017-07-24 ENCOUNTER — Emergency Department (HOSPITAL_COMMUNITY)
Admission: EM | Admit: 2017-07-24 | Discharge: 2017-07-25 | Disposition: A | Discharge status: Home / Self Care | Payer: Managed Care, Other (non HMO) | Attending: Emergency Medicine | Admitting: Emergency Medicine

## 2017-07-24 DIAGNOSIS — R109 Unspecified abdominal pain: Secondary | ICD-10-CM

## 2017-07-24 DIAGNOSIS — Z79899 Other long term (current) drug therapy: Secondary | ICD-10-CM | POA: Diagnosis not present

## 2017-07-24 DIAGNOSIS — R110 Nausea: Secondary | ICD-10-CM | POA: Insufficient documentation

## 2017-07-24 LAB — COMPREHENSIVE METABOLIC PANEL
ALBUMIN: 4.1 g/dL (ref 3.5–5.0)
ALT: 18 U/L (ref 14–54)
AST: 23 U/L (ref 15–41)
Alkaline Phosphatase: 76 U/L (ref 38–126)
Anion gap: 12 (ref 5–15)
BUN: 16 mg/dL (ref 6–20)
CHLORIDE: 108 mmol/L (ref 101–111)
CO2: 19 mmol/L — ABNORMAL LOW (ref 22–32)
Calcium: 9.5 mg/dL (ref 8.9–10.3)
Creatinine, Ser: 1.1 mg/dL — ABNORMAL HIGH (ref 0.44–1.00)
GFR calc Af Amer: >60 mL/min (ref >60)
GFR calc non Af Amer: 56 mL/min — ABNORMAL LOW (ref >60)
GLUCOSE: 85 mg/dL (ref 65–99)
POTASSIUM: 4.9 mmol/L (ref 3.5–5.1)
Sodium: 139 mmol/L (ref 135–145)
Total Bilirubin: 0.8 mg/dL (ref 0.3–1.2)
Total Protein: 7.7 g/dL (ref 6.5–8.1)

## 2017-07-24 LAB — URINALYSIS, ROUTINE W REFLEX MICROSCOPIC
Bacteria, UA: NONE SEEN
Bilirubin Urine: NEGATIVE
Glucose, UA: NEGATIVE mg/dL
Hgb urine dipstick: NEGATIVE
Ketones, ur: NEGATIVE mg/dL
LEUKOCYTES UA: NEGATIVE
Nitrite: NEGATIVE
Protein, ur: NEGATIVE mg/dL
SPECIFIC GRAVITY, URINE: 1.009 (ref 1.005–1.030)
pH: 5 (ref 5.0–8.0)

## 2017-07-24 LAB — I-STAT TROPONIN, ED: Troponin i, poc: 0 ng/mL (ref 0.00–0.08)

## 2017-07-24 LAB — LIPASE, BLOOD: LIPASE: 34 U/L (ref 11–51)

## 2017-07-24 MED ORDER — MORPHINE SULFATE (PF) 4 MG/ML IV SOLN
4.0000 mg | Freq: Once | INTRAVENOUS | Status: AC
  Administered 2017-07-25: 4 mg via INTRAVENOUS
  Filled 2017-07-24: qty 1

## 2017-07-24 MED ORDER — ONDANSETRON HCL 4 MG/2ML IJ SOLN
4.0000 mg | Freq: Once | INTRAMUSCULAR | Status: AC
Start: 2017-07-24 — End: 2017-07-25
  Administered 2017-07-25: 4 mg via INTRAVENOUS
  Filled 2017-07-24: qty 2

## 2017-07-24 NOTE — ED Triage Notes (Signed)
Patient is complaining of right lower abdominal pain. Patient states that her chest feels like she is having indigestion making her having chest discomfort.. Patient states today pain got worst today but started about over a month ago.

## 2017-07-24 NOTE — ED Notes (Signed)
Unable to collect labs at this time patient is in xray 

## 2017-07-24 NOTE — ED Notes (Signed)
Requested patient to u

## 2017-07-24 NOTE — ED Notes (Signed)
I attempted twice to collect labs and was unsuccessful 

## 2017-07-24 NOTE — ED Notes (Signed)
Bed: WA05 Expected date:  Expected time:  Means of arrival:  Comments: 

## 2017-07-24 NOTE — ED Notes (Signed)
Called without answer. X 1

## 2017-07-24 NOTE — ED Provider Notes (Signed)
[facility] DEPT Provider Note   CSN: [id number] Arrival date & time: 07/24/17  1855     History   Chief Complaint Chief Complaint  Patient presents with  . Abdominal Pain    HPI Deanna Choi is a 57 y.o. female.  HPI  This is a 57 year old female with no significant past medical history who presents with right flank pain.  Patient reports 48-month history of intermittent dull right-sided pains that are nonradiating.  They come and go.  Nothing seems to make them better or worse.  However, today she has had pain that has been more constant.  At times it can be 7 out of 10.  Currently it is 3 out of 10.  She has had nausea without vomiting.  No change in bowel habits.  No fevers.  She has not noted any hematuria or dysuria.  No history of kidney stones.  She does note that her pain started after having a colonoscopy where she had "something removed on the right side of my colon."  History reviewed. No pertinent past medical history.  Patient Active Problem List   Diagnosis Date Noted  . HEADACHE 07/06/2009  . PALPITATIONS 07/06/2009  . CHEST DISCOMFORT 07/06/2009    Past Surgical History:  Procedure Laterality Date  . KNEE SURGERY    . SALIVARY GLAND SURGERY    . VAGINAL HYSTERECTOMY       OB History   None      Home Medications    Prior to Admission medications   Medication Sig Start Date End Date Taking? Authorizing Provider  cetirizine (ZYRTEC) 10 MG tablet Take 10 mg by mouth daily.   Yes [provider]  amoxicillin-clavulanate (AUGMENTIN) 875-125 MG per tablet Take 1 tablet by mouth every 12 (twelve) hours. Patient not taking: Reported on 07/24/2017 04/25/13   Vernie Murders K, PA-C  naproxen (NAPROSYN) 500 MG tablet Take 1 tablet (500 mg total) by mouth 2 (two) times daily. 07/25/17   Kendy Haston, Barbette Hair, MD    Family History Family History  Problem Relation Age of Onset  . Hypertension Unknown   . Heart  disease Brother        CABG- secondary to heart disease  . Hypertension Mother   . Hypertension Father   . Stroke Father     Social History Social History   Tobacco Use  . Smoking status: Never Smoker  . Smokeless tobacco: Never Used  Substance Use Topics  . Alcohol use: Yes    Comment: rare  . Drug use: No     Allergies   Patient has no known allergies.   Review of Systems Review of Systems  Constitutional: Negative for fever.  Respiratory: Negative for shortness of breath.   Cardiovascular: Negative for chest pain.  Gastrointestinal: Positive for nausea. Negative for abdominal pain, diarrhea and vomiting.  Genitourinary: Positive for flank pain.  All other systems reviewed and are negative.    Physical Exam Updated Vital Signs BP (!) 146/92 (BP Location: Right Arm)   Pulse 76   Temp 97.8 F (36.6 C) (Oral)   Resp 15   Ht 5\' 6"  (1.676 m)   Wt 91.6 kg (202 lb)   SpO2 100%   BMI 32.60 kg/m   Physical Exam  Constitutional: She is oriented to person, place, and time. She appears well-developed and well-nourished.  Overweight, nontoxic-appearing  HENT:  Head: Normocephalic and atraumatic.  Eyes: Pupils are equal, round, and reactive to light.  Neck:  Neck supple.  Cardiovascular: Normal rate, regular rhythm and normal heart sounds.  Pulmonary/Chest: Effort normal. No respiratory distress. She has no wheezes.  Abdominal: Soft. Bowel sounds are normal. There is no tenderness at McBurney's point and negative Murphy's sign.  Negative CVA tenderness Right mid flank tenderness, no rebound or guarding  Neurological: She is alert and oriented to person, place, and time.  Skin: Skin is warm and dry.  Psychiatric: She has a normal mood and affect.  Nursing note and vitals reviewed.    ED Treatments / Results  Labs (all labs ordered are listed, but only abnormal results are displayed) Labs Reviewed  COMPREHENSIVE METABOLIC PANEL - Abnormal; Notable for the  following components:      Result Value   CO2 19 (*)    Creatinine, Ser 1.10 (*)    GFR calc non Af Amer 56 (*)    All other components within normal limits  URINALYSIS, ROUTINE W REFLEX MICROSCOPIC - Abnormal; Notable for the following components:   Color, Urine STRAW (*)    All other components within normal limits  LIPASE, BLOOD  CBC  I-STAT TROPONIN, ED    EKG EKG Interpretation  Date/Time:  Tuesday Jul 24 2017 19:48:43 EDT Ventricular Rate:  68 PR Interval:    QRS Duration: 86 QT Interval:  396 QTC Calculation: 422 R Axis:   41 Text Interpretation:  Sinus rhythm Low voltage, extremity and precordial leads Confirmed by Thayer Jew [phone]) on 07/24/2017 11:03:49 PM   Radiology Dg Chest 2 View  Result Date: 07/24/2017 CLINICAL DATA:  Right lower abdominal pain EXAM: CHEST - 2 VIEW COMPARISON:  None. FINDINGS: Heart and mediastinal contours are within normal limits. No focal opacities or effusions. No acute bony abnormality. IMPRESSION: No active cardiopulmonary disease. Electronically Signed   By: Rolm Baptise M.D.   On: 07/24/2017 20:57   Ct Abdomen Pelvis W Contrast  Result Date: 07/25/2017 CLINICAL DATA:  Acute abdominal pain. EXAM: CT ABDOMEN AND PELVIS WITH CONTRAST TECHNIQUE: Multidetector CT imaging of the abdomen and pelvis was performed using the standard protocol following bolus administration of intravenous contrast. CONTRAST:  126mL ISOVUE-300 IOPAMIDOL (ISOVUE-300) INJECTION 61% COMPARISON:  None. FINDINGS: Lower chest: No acute abnormality. Hepatobiliary: Borderline hepatic steatosis. No discrete focal lesion. Gallbladder physiologically distended, no calcified stone. No biliary dilatation. Pancreas: No ductal dilatation or inflammation. Spleen: Normal in size without focal abnormality. Rounded soft tissue nodule anteriorly consistent with splenule. Adrenals/Urinary Tract: No adrenal nodule. No hydronephrosis or perinephric edema. Homogeneous renal enhancement  with symmetric excretion on delayed phase imaging. Parapelvic cysts in the left kidney. Urinary bladder is nondistended. Stomach/Bowel: Stomach is decompressed. No bowel obstruction, bowel wall thickening or inflammatory change. Distal colonic diverticulosis without diverticulitis. Normal appendix for example image 41 series 4. Vascular/Lymphatic: No significant vascular findings are present. No enlarged abdominal or pelvic lymph nodes. Reproductive: Status post hysterectomy. No adnexal masses. Other: No free air, free fluid, or intra-abdominal fluid collection. Musculoskeletal: There are no acute or suspicious osseous abnormalities. IMPRESSION: 1. No acute abnormality in the abdomen/pelvis. 2. Distal colonic diverticulosis without diverticulitis. Electronically Signed   By: Jeb Levering M.D.   On: 07/25/2017 03:00    Procedures Procedures (including critical care time)  Medications Ordered in ED Medications  iopamidol (ISOVUE-300) 61 % injection (has no administration in time range)  morphine 4 MG/ML injection 4 mg (4 mg Intravenous Given 07/25/17 0203)  ondansetron (ZOFRAN) injection 4 mg (4 mg Intravenous Given 07/25/17 0203)  iopamidol (ISOVUE-300) 61 %  injection 100 mL (100 mLs Intravenous Contrast Given 07/25/17 0228)     Initial Impression / Assessment and Plan / ED Course  I have reviewed the triage vital signs and the nursing notes.  Pertinent labs & imaging results that were available during my care of the patient were reviewed by me and considered in my medical decision making (see chart for details).     Patient presents with right-sided flank and abdominal pain.  She reports similar symptoms over the last 6 months but worsening over the last day.  She is overall nontoxic-appearing and vital signs are reassuring.  Some tenderness on exam.  No overlying skin changes.  Considerations include kidney stone, colitis, less likely appendicitis given duration of symptoms.  Could be  musculoskeletal nature.  Doubt shingles given overlying skin changes.  Lab work-up is largely reassuring.  Patient was given pain and nausea medication.  CT scan obtained and is largely unremarkable with exception of diverticulosis without diverticulitis.  Patient was updated.  Given onset of symptoms after colonoscopy, recommend follow-up with GI if symptoms persist.  After history, exam, and medical workup I feel the patient has been appropriately medically screened and is safe for discharge home. Pertinent diagnoses were discussed with the patient. Patient was given return precautions.   Final Clinical Impressions(s) / ED Diagnoses   Final diagnoses:  Right flank pain    ED Discharge Orders        Ordered    naproxen (NAPROSYN) 500 MG tablet  2 times daily     07/25/17 0327       Morey Andonian, Barbette Hair, MD 07/25/17 [phone]

## 2017-07-25 ENCOUNTER — Encounter (HOSPITAL_COMMUNITY): Payer: Self-pay

## 2017-07-25 ENCOUNTER — Emergency Department (HOSPITAL_COMMUNITY): Payer: Managed Care, Other (non HMO)

## 2017-07-25 MED ORDER — NAPROXEN 500 MG PO TABS: 500.0000 mg | ORAL_TABLET | Freq: Two times a day (BID) | ORAL | 0 refills | Status: DC

## 2017-07-25 MED ORDER — IOPAMIDOL (ISOVUE-300) INJECTION 61%
INTRAVENOUS | Status: AC
  Filled 2017-07-25: qty 100

## 2017-07-25 MED ORDER — IOPAMIDOL (ISOVUE-300) INJECTION 61%
100.0000 mL | Freq: Once | INTRAVENOUS | Status: AC | PRN
  Administered 2017-07-25: 100 mL via INTRAVENOUS

## 2017-07-25 NOTE — Discharge Instructions (Addendum)
You were seen today for right flank and abdominal pain.  The cause of your pain at this time is unknown.  Your work-up is reassuring including CT scan.  Follow-up with your GI doc if symptoms persist.

## 2021-03-28 ENCOUNTER — Encounter: Payer: Self-pay | Admitting: Internal Medicine

## 2021-04-25 ENCOUNTER — Encounter: Payer: Self-pay | Admitting: Internal Medicine

## 2021-04-25 ENCOUNTER — Ambulatory Visit (INDEPENDENT_AMBULATORY_CARE_PROVIDER_SITE_OTHER): Payer: BC Managed Care – PPO | Admitting: Internal Medicine

## 2021-04-25 VITALS — BP 122/70 | HR 61 | Ht 66.0 in | Wt 222.0 lb

## 2021-04-25 DIAGNOSIS — Z86010 Personal history of colonic polyps: Secondary | ICD-10-CM | POA: Diagnosis not present

## 2021-04-25 DIAGNOSIS — R1310 Dysphagia, unspecified: Secondary | ICD-10-CM

## 2021-04-25 MED ORDER — SUTAB 1479-225-188 MG PO TABS: 1.0000 | ORAL_TABLET | ORAL | 0 refills | Status: DC

## 2021-04-25 NOTE — Progress Notes (Signed)
Chief Complaint: Dysphagia  HPI : 61 year old female with history of obesity, headache, diverticulosis presents with dysphagia  She has had dysphagia for years. This dysphagia has been getting worse over time. She has been having dysphagia for the last year once a week. The food gets stuck at the top of the throat and eventually goes down on its own by itself. The dysphagia is to solids. She has never had a food impaction in the past. Denies prior EGD. Denies asthma. She does have seasonal allergies. Both of her parents had dysphagia issues and have had esophageal dilations in the past. Denies chest burning or regurgitation. Denies blood in stools, ab pain, N&V. Denies odynophagia. Denies weight loss. Never smoker. Has 1-2 alcoholic beverage per years. Last colonoscopy was in 2020 with a few polyps at that time, and she was recommended for 3 year follow up. Denies fam hx of colon cancer.  Wt Readings from Last 3 Encounters:  04/25/21 222 lb (100.7 kg)  07/24/17 202 lb (91.6 kg)  04/25/13 200 lb (90.7 kg)   Past Medical History:  Diagnosis Date   Diverticulosis    Headache    Hypertension    Hypomagnesemia    Obesity      Past Surgical History:  Procedure Laterality Date   KNEE SURGERY     SALIVARY GLAND SURGERY     SQUAMOUS CELL CARCINOMA EXCISION     VAGINAL HYSTERECTOMY  2009   Family History  Problem Relation Age of Onset   Hypertension Mother    Colon polyps Mother    Hypertension Father    Stroke Father    Irritable bowel syndrome Father    Heart disease Brother        CABG- secondary to heart disease   Hypertension Other    Colon cancer Neg Hx    Esophageal cancer Neg Hx    Stomach cancer Neg Hx    Pancreatic cancer Neg Hx    Liver cancer Neg Hx    Rectal cancer Neg Hx    Social History   Tobacco Use   Smoking status: Never   Smokeless tobacco: Never  Vaping Use   Vaping Use: Never used  Substance Use Topics   Alcohol use: Not Currently    Comment: rare    Drug use: No   Current Outpatient Medications  Medication Sig Dispense Refill   Cholecalciferol (VITAMIN D) 125 MCG (5000 UT) CAPS Take 1 capsule by mouth daily.     Multiple Vitamin (MULTIVITAMIN) tablet Take 1 tablet by mouth daily.     Zinc Sulfate (ZINC 15 PO) Take 1 tablet by mouth daily.     No current facility-administered medications for this visit.   No Known Allergies   Review of Systems: All systems reviewed and negative except where noted in HPI.   Physical Exam: BP 122/70    Pulse 61    Ht 5\' 6"  (1.676 m)    Wt 222 lb (100.7 kg)    SpO2 96%    BMI 35.83 kg/m  Constitutional: Pleasant,well-developed, female in no acute distress. HEENT: Normocephalic and atraumatic. Conjunctivae are normal. No scleral icterus. Cardiovascular: Normal rate, regular rhythm.  Pulmonary/chest: Effort normal and breath sounds normal. No wheezing, rales or rhonchi. Abdominal: Soft, nondistended, nontender. Bowel sounds active throughout. There are no masses palpable. No hepatomegaly. Extremities: No edema Neurological: Alert and oriented to person place and time. Skin: Skin is warm and dry. No rashes noted. Psychiatric: Normal mood and affect.  Behavior is normal.  Labs 07/2017: CMP with mildly elevated Cr of 1.1. Lipase normal.  CT A/P w/contrast 07/25/17: IMPRESSION: 1. No acute abnormality in the abdomen/pelvis. 2. Distal colonic diverticulosis without diverticulitis.  ASSESSMENT AND PLAN: Dysphagia History of colon polyps Patient presents with longstanding dysphagia that has been worsening over time. Will plan for further evaluation with barium swallow and EGD. Patient has history of colon polyps and is due for follow up at this time. Will add on colonoscopy to EGD for further evaluation. - Barium swallow - EGD/colonoscopy [facility]. Sutab prep.  [name], MD

## 2021-04-25 NOTE — Patient Instructions (Signed)
You have been scheduled for a Barium Esophogram at [facility] (1st floor of the hospital) on 05/06/21 at 10:30am. Please arrive 15 minutes prior to your appointment for registration. Make certain not to have anything to eat or drink 3 hours prior to your test. If you need to reschedule for any reason, please contact radiology at [phone] to do so. __________________________________________________________ A barium swallow is an examination that concentrates on views of the esophagus. This tends to be a double contrast exam (barium and two liquids which, when combined, create a gas to distend the wall of the oesophagus) or single contrast (non-ionic iodine based). The study is usually tailored to your symptoms so a good history is essential. Attention is paid during the study to the form, structure and configuration of the esophagus, looking for functional disorders (such as aspiration, dysphagia, achalasia, motility and reflux) EXAMINATION You may be asked to change into a gown, depending on the type of swallow being performed. A radiologist and radiographer will perform the procedure. The radiologist will advise you of the type of contrast selected for your procedure and direct you during the exam. You will be asked to stand, sit or lie in several different positions and to hold a small amount of fluid in your mouth before being asked to swallow while the imaging is performed .In some instances you may be asked to swallow barium coated marshmallows to assess the motility of a solid food bolus. The exam can be recorded as a digital or video fluoroscopy procedure. POST PROCEDURE It will take 1-2 days for the barium to pass through your system. To facilitate this, it is important, unless otherwise directed, to increase your fluids for the next 24-48hrs and to resume your normal diet.  This test typically takes about 30 minutes to  perform. ________________________________________________________________  Deanna Choi have been scheduled for an endoscopy and colonoscopy. Please follow the written instructions given to you at your visit today. Please pick up your prep supplies at the pharmacy within the next 1-3 days. If you use inhalers (even only as needed), please bring them with you on the day of your procedure.  The [facility] providers would like to encourage you to use [facility] to communicate with providers for non-urgent requests or questions.  Due to long hold times on the telephone, sending your provider a message by [facility] may be a faster and more efficient way to get a response.  Please allow 48 business hours for a response.  Please remember that this is for non-urgent requests.   Due to recent changes in healthcare laws, you may see the results of your imaging and laboratory studies on MyChart before your provider has had a chance to review them.  We understand that in some cases there may be results that are confusing or concerning to you. Not all laboratory results come back in the same time frame and the provider may be waiting for multiple results in order to interpret others.  Please give [place] 48 hours in order for your provider to thoroughly review all the results before contacting the office for clarification of your results.

## 2021-05-06 ENCOUNTER — Other Ambulatory Visit: Payer: Self-pay

## 2021-05-06 ENCOUNTER — Ambulatory Visit (HOSPITAL_COMMUNITY)
Admission: RE | Admit: 2021-05-06 | Discharge: 2021-05-06 | Disposition: A | Discharge status: Home / Self Care | Payer: BC Managed Care – PPO | Source: Ambulatory Visit | Attending: Internal Medicine | Admitting: Internal Medicine

## 2021-05-06 DIAGNOSIS — R1310 Dysphagia, unspecified: Secondary | ICD-10-CM | POA: Diagnosis not present

## 2021-06-04 HISTORY — PX: UPPER GASTROINTESTINAL ENDOSCOPY: SHX188

## 2021-06-14 ENCOUNTER — Encounter: Payer: Self-pay | Admitting: Internal Medicine

## 2021-06-22 ENCOUNTER — Other Ambulatory Visit: Payer: Self-pay

## 2021-06-22 ENCOUNTER — Encounter: Payer: Self-pay | Admitting: Internal Medicine

## 2021-06-22 ENCOUNTER — Other Ambulatory Visit: Payer: Self-pay | Admitting: Internal Medicine

## 2021-06-22 ENCOUNTER — Ambulatory Visit (AMBULATORY_SURGERY_CENTER): Payer: BC Managed Care – PPO | Admitting: Internal Medicine

## 2021-06-22 VITALS — BP 130/85 | HR 60 | Temp 96.8°F | Resp 12 | Ht 66.0 in | Wt 222.0 lb

## 2021-06-22 DIAGNOSIS — R1310 Dysphagia, unspecified: Secondary | ICD-10-CM | POA: Diagnosis present

## 2021-06-22 DIAGNOSIS — K2210 Ulcer of esophagus without bleeding: Secondary | ICD-10-CM | POA: Diagnosis not present

## 2021-06-22 DIAGNOSIS — K635 Polyp of colon: Secondary | ICD-10-CM | POA: Diagnosis not present

## 2021-06-22 DIAGNOSIS — K222 Esophageal obstruction: Secondary | ICD-10-CM | POA: Diagnosis not present

## 2021-06-22 DIAGNOSIS — K2970 Gastritis, unspecified, without bleeding: Secondary | ICD-10-CM | POA: Diagnosis not present

## 2021-06-22 DIAGNOSIS — K259 Gastric ulcer, unspecified as acute or chronic, without hemorrhage or perforation: Secondary | ICD-10-CM

## 2021-06-22 DIAGNOSIS — K449 Diaphragmatic hernia without obstruction or gangrene: Secondary | ICD-10-CM | POA: Diagnosis not present

## 2021-06-22 DIAGNOSIS — Z86010 Personal history of colonic polyps: Secondary | ICD-10-CM | POA: Diagnosis not present

## 2021-06-22 DIAGNOSIS — K2950 Unspecified chronic gastritis without bleeding: Secondary | ICD-10-CM | POA: Diagnosis not present

## 2021-06-22 DIAGNOSIS — D122 Benign neoplasm of ascending colon: Secondary | ICD-10-CM

## 2021-06-22 MED ORDER — SODIUM CHLORIDE 0.9 % IV SOLN: 500.0000 mL | Freq: Once | INTRAVENOUS | Status: DC

## 2021-06-22 MED ORDER — OMEPRAZOLE 40 MG PO CPDR: 40.0000 mg | DELAYED_RELEASE_CAPSULE | Freq: Two times a day (BID) | ORAL | 2 refills | Status: DC

## 2021-06-22 NOTE — Patient Instructions (Signed)
Please read handouts provided. ?Await pathology results. ?Repeat colonoscopy in 5 years for screening. ?Begin Prilosec ( omeprazole ) 40 mg twice daily for 12 weeks. ?Repeat upper endoscopy in 2 months to check for healing. ? ? ? ? ?YOU HAD AN ENDOSCOPIC PROCEDURE TODAY AT [facility]:   Refer to the procedure report that was given to you for any specific questions about what was found during the examination.  If the procedure report does not answer your questions, please call your gastroenterologist to clarify.  If you requested that your care partner not be given the details of your procedure findings, then the procedure report has been included in a sealed envelope for you to review at your convenience later. ? ?YOU SHOULD EXPECT: Some feelings of bloating in the abdomen. Passage of more gas than usual.  Walking can help get rid of the air that was put into your GI tract during the procedure and reduce the bloating. If you had a lower endoscopy (such as a colonoscopy or flexible sigmoidoscopy) you may notice spotting of blood in your stool or on the toilet paper. If you underwent a bowel prep for your procedure, you may not have a normal bowel movement for a few days. ? ?Please Note:  You might notice some irritation and congestion in your nose or some drainage.  This is from the oxygen used during your procedure.  There is no need for concern and it should clear up in a day or so. ? ?SYMPTOMS TO REPORT IMMEDIATELY: ? ?Following lower endoscopy (colonoscopy or flexible sigmoidoscopy): ? Excessive amounts of blood in the stool ? Significant tenderness or worsening of abdominal pains ? Swelling of the abdomen that is new, acute ? Fever of 100?F or higher ? ?Following upper endoscopy (EGD) ? Vomiting of blood or coffee ground material ? New chest pain or pain under the shoulder blades ? Painful or persistently difficult swallowing ? New shortness of breath ? Fever of 100?F or higher ? Black,  tarry-looking stools ? ?For urgent or emergent issues, a gastroenterologist can be reached at any hour by calling [phone]. ?Do not use MyChart messaging for urgent concerns.  ? ? ?DIET:  We do recommend a small meal at first, but then you may proceed to your regular diet.  Drink plenty of fluids but you should avoid alcoholic beverages for 24 hours. ? ?ACTIVITY:  You should plan to take it easy for the rest of today and you should NOT DRIVE or use heavy machinery until tomorrow (because of the sedation medicines used during the test).   ? ?FOLLOW UP: ?Our staff will call the number listed on your records 48-72 hours following your procedure to check on you and address any questions or concerns that you may have regarding the information given to you following your procedure. If we do not reach you, we will leave a message.  We will attempt to reach you two times.  During this call, we will ask if you have developed any symptoms of COVID 19. If you develop any symptoms (ie: fever, flu-like symptoms, shortness of breath, cough etc.) before then, please call [phone].  If you test positive for Covid 19 in the 2 weeks post procedure, please call and report this information to [place].   ? ?If any biopsies were taken you will be contacted by phone or by letter within the next 1-3 weeks.  Please call us at [phone] if you have not heard about the  biopsies in 3 weeks.  ? ? ?SIGNATURES/CONFIDENTIALITY: ?You and/or your care partner have signed paperwork which will be entered into your electronic medical record.  These signatures attest to the fact that that the information above on your After Visit Summary has been reviewed and is understood.  Full responsibility of the confidentiality of this discharge information lies with you and/or your care-partner.  ?

## 2021-06-22 NOTE — Op Note (Signed)
[place] ?Patient Name: Deanna Choi ?Procedure Date: 06/22/2021 9:06 AM ?MRN: [id number] ?Endoscopist: Sonny Masters "[name] ,  ?Age: 61 ?Referring MD:  ?Date of Birth: November 12, 1960 ?Gender: Female ?Account #: [id number] ?Procedure:                Colonoscopy ?Indications:              High risk colon cancer surveillance: Personal  ?                          history of colonic polyps ?Medicines:                Monitored Anesthesia Care ?Procedure:                After obtaining informed consent, the colonoscope  ?                          was passed under direct vision. Throughout the  ?                          procedure, the patient's blood pressure, pulse, and  ?                          oxygen saturations were monitored continuously. The  ?                          Olympus CF-HQ190L (Serial# [id number]) Colonoscope was  ?                          introduced through the anus and advanced to the the  ?                          terminal ileum. The colonoscopy was performed  ?                          without difficulty. The patient tolerated the  ?                          procedure well. The quality of the bowel  ?                          preparation was adequate. The terminal ileum,  ?                          ileocecal valve, appendiceal orifice, and rectum  ?                          were photographed. ?Scope In: 9:31:25 AM ?Scope Out: 9:47:45 AM ?Scope Withdrawal Time: 0 hours 14 minutes 3 seconds  ?Total Procedure Duration: 0 hours 16 minutes 20 seconds  ?Findings:                 The terminal ileum appeared normal. ?                          Multiple small and large-mouthed diverticula were  ?  found in the sigmoid colon, descending colon,  ?                          transverse colon and ascending colon. ?                          A tattoo was seen in the ascending colon. The  ?                          tattoo site appeared normal. ?                          A 2 mm polyp was  found in the ascending colon. The  ?                          polyp was sessile. The polyp was removed with a  ?                          cold biopsy forceps. Resection and retrieval were  ?                          complete. ?Complications:            No immediate complications. ?Estimated Blood Loss:     Estimated blood loss was minimal. ?Impression:               - The examined portion of the ileum was normal. ?                          - Diverticulosis in the sigmoid colon, in the  ?                          descending colon, in the transverse colon and in  ?                          the ascending colon. ?                          - A tattoo was seen in the ascending colon. The  ?                          tattoo site appeared normal. ?                          - One 2 mm polyp in the ascending colon, removed  ?                          with a cold biopsy forceps. Resected and retrieved. ?Recommendation:           - Discharge patient to home (with escort). ?                          - Await pathology results. ?                          -  Repeat colonoscopy in 5 years for surveillance  ?                          based on personal history of previous adenomatous  ?                          polyps. ?                          - The findings and recommendations were discussed  ?                          with the patient. ?Georgian Co,  ?06/22/2021 9:59:04 AM ?

## 2021-06-22 NOTE — Op Note (Signed)
[place] ?Patient Name: Deanna Choi ?Procedure Date: 06/22/2021 9:08 AM ?MRN: [id number] ?Endoscopist: Deanna Choi "[name] ,  ?Age: 61 ?Referring MD:  ?Date of Birth: 1960/09/06 ?Gender: Female ?Account #: [id number] ?Procedure:                Upper GI endoscopy ?Indications:              Dysphagia ?Medicines:                Monitored Anesthesia Care ?Procedure:                Pre-Anesthesia Assessment: ?                          - Prior to the procedure, a History and Physical  ?                          was performed, and patient medications and  ?                          allergies were reviewed. The patient's tolerance of  ?                          previous anesthesia was also reviewed. The risks  ?                          and benefits of the procedure and the sedation  ?                          options and risks were discussed with the patient.  ?                          All questions were answered, and informed consent  ?                          was obtained. Prior Anticoagulants: The patient has  ?                          taken no previous anticoagulant or antiplatelet  ?                          agents. ASA Grade Assessment: II - A patient with  ?                          mild systemic disease. After reviewing the risks  ?                          and benefits, the patient was deemed in  ?                          satisfactory condition to undergo the procedure. ?                          After obtaining informed consent, the endoscope was  ?  passed under direct vision. Throughout the  ?                          procedure, the patient's blood pressure, pulse, and  ?                          oxygen saturations were monitored continuously. The  ?                          Endoscope was introduced through the mouth, and  ?                          advanced to the second part of duodenum. The upper  ?                          GI endoscopy was accomplished without  difficulty.  ?                          The patient tolerated the procedure well. ?Scope In: ?Scope Out: ?Findings:                 Biopsies were taken with a cold forceps in the  ?                          proximal esophagus and in the distal esophagus for  ?                          histology. ?                          One benign-appearing, intrinsic moderate  ?                          (circumferential scarring or stenosis; an endoscope  ?                          may pass) stenosis was found 36 cm from the  ?                          incisors at the GE junction. This stenosis measured  ?                          1 cm (in length). The stenosis was traversed. A TTS  ?                          dilator was passed through the scope. Dilation with  ?                          an 18-19-20 mm balloon dilator was performed to 20  ?                          mm. The dilation site was examined following  ?  endoscope reinsertion and showed mild mucosal  ?                          disruption. ?                          A 3 cm hiatal hernia was present. ?                          Twelve non-bleeding cratered gastric ulcers with a  ?                          clean ulcer base (Forrest Class III), some did have  ?                          overlying hematin, were found in the gastric  ?                          antrum. The largest lesion was 8 mm in largest  ?                          dimension. Biopsies were taken with a cold forceps  ?                          for histology. ?                          The examined duodenum was normal. ?Complications:            No immediate complications. ?Estimated Blood Loss:     Estimated blood loss was minimal. ?Impression:               - Benign-appearing esophageal stenosis. Dilated. ?                          - 3 cm hiatal hernia. ?                          - Non-bleeding gastric ulcers with a clean ulcer  ?                          base (Forrest Class III).  Biopsied. ?                          - Normal examined duodenum. ?                          - Biopsies were taken with a cold forceps for  ?                          histology in the proximal esophagus and in the  ?                          distal esophagus. ?Recommendation:           - Use Prilosec (omeprazole) 40 mg PO BID for 12  ?  weeks. ?                          - Await pathology results. ?                          - Repeat upper endoscopy in 2 months to check  ?                          healing. ?                          - Perform a colonoscopy today. ?Georgian Co,  ?06/22/2021 9:55:08 AM ?

## 2021-06-22 NOTE — Progress Notes (Signed)
Called to room to assist during endoscopic procedure.  Patient ID and intended procedure confirmed with present staff. Received instructions for my participation in the procedure from the performing physician.  

## 2021-06-22 NOTE — Progress Notes (Signed)
Not able to schedule an upper endoscopy today for patient in 2 months. Patient is unsure of her schedule. Patient will call to schedule upper endoscopy.  ?B.Castle Lamons RN. ?

## 2021-06-22 NOTE — Progress Notes (Signed)
? ?GASTROENTEROLOGY PROCEDURE H&P NOTE  ? ?Primary Care Physician: ?Fredrich Romans, PA ? ? ? ?Reason for Procedure:   Dysphagia, history of colon polyps ? ?Plan:    EGD/colonoscopy ? ?Patient is appropriate for endoscopic procedure(s) in the ambulatory ([facility]) setting. ? ?The nature of the procedure, as well as the risks, benefits, and alternatives were carefully and thoroughly reviewed with the patient. Ample time for discussion and questions allowed. The patient understood, was satisfied, and agreed to proceed.  ? ? ? ?HPI: ?Deanna Choi is a 61 y.o. female who presents for EGD/colonoscopy for evaluation of dysphagia and history of colon polyps .  Patient was most recently seen in the Gastroenterology Clinic on 04/25/21.  No interval change in medical history since that appointment. Please refer to that note for full details regarding GI history and clinical presentation.  ? ?Past Medical History:  ?Diagnosis Date  ? Allergy   ? Diverticulosis   ? Glaucoma   ? Headache   ? Hypertension   ? Hypomagnesemia   ? Obesity   ? Skin cancer   ? ? ?Past Surgical History:  ?Procedure Laterality Date  ? KNEE SURGERY    ? SALIVARY GLAND SURGERY    ? SQUAMOUS CELL CARCINOMA EXCISION    ? VAGINAL HYSTERECTOMY  2009  ? ? ?Prior to Admission medications   ?Medication Sig Start Date End Date Taking? Authorizing Provider  ?Cholecalciferol (VITAMIN D) 125 MCG (5000 UT) CAPS Take 1 capsule by mouth daily.    [provider]  ?Multiple Vitamin (MULTIVITAMIN) tablet Take 1 tablet by mouth daily.    [provider]  ?Zinc Sulfate (ZINC 15 PO) Take 1 tablet by mouth daily.    [provider]  ? ? ?Current Outpatient Medications  ?Medication Sig Dispense Refill  ? Cholecalciferol (VITAMIN D) 125 MCG (5000 UT) CAPS Take 1 capsule by mouth daily.    ? Multiple Vitamin (MULTIVITAMIN) tablet Take 1 tablet by mouth daily.    ? Zinc Sulfate (ZINC 15 PO) Take 1 tablet by mouth daily.    ? ?Current  Facility-Administered Medications  ?Medication Dose Route Frequency Provider Last Rate Last Admin  ? 0.9 %  sodium chloride infusion  500 mL Intravenous Once Sharyn Creamer, MD      ? ? ?Allergies as of 06/22/2021  ? (Not on File)  ? ? ?Family History  ?Problem Relation Age of Onset  ? Hypertension Mother   ? Colon polyps Mother   ? Hypertension Father   ? Stroke Father   ? Irritable bowel syndrome Father   ? Heart disease Brother   ?     CABG- secondary to heart disease  ? Hypertension Other   ? Colon cancer Neg Hx   ? Esophageal cancer Neg Hx   ? Stomach cancer Neg Hx   ? Pancreatic cancer Neg Hx   ? Liver cancer Neg Hx   ? Rectal cancer Neg Hx   ? ? ?Social History  ? ?Socioeconomic History  ? Marital status: Married  ?  Spouse name: Not on file  ? Number of children: 2  ? Years of education: Not on file  ? Highest education level: Not on file  ?Occupational History  ? Occupation: Geophysical data processor - [employer]  ?Tobacco Use  ? Smoking status: Never  ? Smokeless tobacco: Never  ?Vaping Use  ? Vaping Use: Never used  ?Substance and Sexual Activity  ? Alcohol use: Not Currently  ?  Comment: rare  ?  Drug use: No  ? Sexual activity: Not on file  ?Other Topics Concern  ? Not on file  ?Social History Narrative  ? Not on file  ? ?Social Determinants of Health  ? ?Financial Resource Strain: Not on file  ?Food Insecurity: Not on file  ?Transportation Needs: Not on file  ?Physical Activity: Not on file  ?Stress: Not on file  ?Social Connections: Not on file  ?Intimate Partner Violence: Not on file  ? ? ?Physical Exam: ?Vital signs in last 24 hours: ?BP 123/69   Pulse 68   Temp (!) 96.8 ?F (36 ?C)   Resp 16   Ht '5\' 6"'$  (1.676 m)   Wt 222 lb (100.7 kg)   SpO2 99%   BMI 35.83 kg/m?  ?GEN: NAD ?EYE: Sclerae anicteric ?ENT: MMM ?CV: Non-tachycardic ?Pulm: No increased WOB ?GI: Soft ?NEURO:  Alert & Oriented ? ? ?[name], MD ?[facility] ? ? ?06/22/2021 9:48 AM ? ?

## 2021-06-22 NOTE — Progress Notes (Signed)
To Pacu, VSs. Report to Rn.tb ?

## 2021-06-23 ENCOUNTER — Encounter: Payer: Self-pay | Admitting: Internal Medicine

## 2021-06-24 ENCOUNTER — Telehealth: Payer: Self-pay

## 2021-06-24 NOTE — Telephone Encounter (Signed)
?  Follow up Call- ? ? ?  06/22/2021  ?  8:15 AM  ?Call back number  ?Post procedure Call Back phone  # [phone]  ?Permission to leave phone message Yes  ?  ? ?Patient questions: ? ?Do you have a fever, pain , or abdominal swelling? No. ?Pain Score  0 * ? ?Have you tolerated food without any problems? Yes.   ? ?Have you been able to return to your normal activities? Yes.   ? ?Do you have any questions about your discharge instructions: ?Diet   No. ?Medications  No. ?Follow up visit  No. ? ?Do you have questions or concerns about your Care? No. ? ?Actions: ?* If pain score is 4 or above: ?No action needed, pain <4. ? ? ?

## 2021-06-27 ENCOUNTER — Encounter: Payer: Self-pay | Admitting: Internal Medicine

## 2021-08-21 ENCOUNTER — Encounter: Payer: Self-pay | Admitting: Certified Registered Nurse Anesthetist

## 2021-08-29 ENCOUNTER — Encounter: Payer: Self-pay | Admitting: Internal Medicine

## 2021-08-29 ENCOUNTER — Ambulatory Visit (AMBULATORY_SURGERY_CENTER): Payer: BC Managed Care – PPO | Admitting: Internal Medicine

## 2021-08-29 ENCOUNTER — Other Ambulatory Visit: Payer: BC Managed Care – PPO

## 2021-08-29 VITALS — BP 133/76 | HR 69 | Temp 97.8°F | Resp 15 | Ht 66.0 in | Wt 222.0 lb

## 2021-08-29 DIAGNOSIS — K259 Gastric ulcer, unspecified as acute or chronic, without hemorrhage or perforation: Secondary | ICD-10-CM | POA: Diagnosis present

## 2021-08-29 DIAGNOSIS — K222 Esophageal obstruction: Secondary | ICD-10-CM

## 2021-08-29 DIAGNOSIS — K449 Diaphragmatic hernia without obstruction or gangrene: Secondary | ICD-10-CM

## 2021-08-29 MED ORDER — SODIUM CHLORIDE 0.9 % IV SOLN: 500.0000 mL | Freq: Once | INTRAVENOUS | Status: DC

## 2021-08-29 NOTE — Progress Notes (Signed)
Report given to PACU, vss 

## 2021-08-29 NOTE — Progress Notes (Signed)
GASTROENTEROLOGY PROCEDURE H&P NOTE   Primary Care Physician: Burnice Logan, PA    Reason for Procedure:   Follow up of gastric ulcers  Plan:    EGD  Patient is appropriate for endoscopic procedure(s) in the ambulatory ([facility]) setting.  The nature of the procedure, as well as the risks, benefits, and alternatives were carefully and thoroughly reviewed with the patient. Ample time for discussion and questions allowed. The patient understood, was satisfied, and agreed to proceed.     HPI: Deanna Choi is a 61 y.o. female who presents for EGD for follow up of gastric ulcers.  Past Medical History:  Diagnosis Date   Allergy    Diverticulosis    gastric ulcers    Glaucoma    Headache    Hypertension    Hypomagnesemia    Obesity    Skin cancer     Past Surgical History:  Procedure Laterality Date   KNEE SURGERY     SALIVARY GLAND SURGERY     SQUAMOUS CELL CARCINOMA EXCISION     UPPER GASTROINTESTINAL ENDOSCOPY  06/2021   VAGINAL HYSTERECTOMY  2009    Prior to Admission medications   Medication Sig Start Date End Date Taking? Authorizing Provider  Cholecalciferol (VITAMIN D) 125 MCG (5000 UT) CAPS Take 1 capsule by mouth daily.    [provider]  Multiple Vitamin (MULTIVITAMIN) tablet Take 1 tablet by mouth daily.    [provider]  omeprazole (PRILOSEC) 40 MG capsule Take 1 capsule (40 mg total) by mouth 2 (two) times daily. 06/22/21 09/14/21  Imogene Burn, MD  Zinc Sulfate (ZINC 15 PO) Take 1 tablet by mouth daily.    [provider]    Current Outpatient Medications  Medication Sig Dispense Refill   Cholecalciferol (VITAMIN D) 125 MCG (5000 UT) CAPS Take 1 capsule by mouth daily.     Multiple Vitamin (MULTIVITAMIN) tablet Take 1 tablet by mouth daily.     omeprazole (PRILOSEC) 40 MG capsule Take 1 capsule (40 mg total) by mouth 2 (two) times daily. 168 capsule 0   Zinc Sulfate (ZINC 15 PO) Take 1 tablet by mouth daily.      Current Facility-Administered Medications  Medication Dose Route Frequency Provider Last Rate Last Admin   0.9 %  sodium chloride infusion  500 mL Intravenous Once Imogene Burn, MD        Allergies as of 08/29/2021   (No Known Allergies)    Family History  Problem Relation Age of Onset   Hypertension Mother    Colon polyps Mother    Hypertension Father    Stroke Father    Irritable bowel syndrome Father    Heart disease Brother        CABG- secondary to heart disease   Hypertension Other    Colon cancer Neg Hx    Esophageal cancer Neg Hx    Stomach cancer Neg Hx    Pancreatic cancer Neg Hx    Liver cancer Neg Hx    Rectal cancer Neg Hx     Social History   Socioeconomic History   Marital status: Married    Spouse name: Not on file   Number of children: 2   Years of education: Not on file   Highest education level: Not on file  Occupational History   Occupation: Dietitian - [employer]  Tobacco Use   Smoking status: Never   Smokeless tobacco: Never  Vaping Use   Vaping Use: Never  used  Substance and Sexual Activity   Alcohol use: Not Currently    Comment: rare   Drug use: No   Sexual activity: Not on file  Other Topics Concern   Not on file  Social History Narrative   Not on file   Social Determinants of Health   Financial Resource Strain: Not on file  Food Insecurity: Not on file  Transportation Needs: Not on file  Physical Activity: Not on file  Stress: Not on file  Social Connections: Not on file  Intimate Partner Violence: Not on file    Physical Exam: Vital signs in last 24 hours: BP (!) 151/70   Pulse (!) 59   Temp 97.8 F (36.6 C)   Ht 5\' 6"  (1.676 m)   Wt 222 lb (100.7 kg)   SpO2 98%   BMI 35.83 kg/m  GEN: NAD EYE: Sclerae anicteric ENT: MMM CV: Non-tachycardic Pulm: No increased work of breathing GI: Soft, NT/ND NEURO:  Alert & Oriented   Eulah Pont, MD [facility]  08/29/2021 1:18 PM

## 2021-08-30 ENCOUNTER — Telehealth: Payer: Self-pay

## 2021-08-30 NOTE — Telephone Encounter (Signed)
No answer, left message to call back if having any issues or concerns, B.Allissa Albright RN. 

## 2021-09-01 ENCOUNTER — Encounter: Payer: Self-pay | Admitting: Internal Medicine

## 2021-09-01 ENCOUNTER — Other Ambulatory Visit: Payer: Self-pay

## 2021-09-04 LAB — GASTRIN: Gastrin: 22 pg/mL (ref <=100)

## 2021-09-26 ENCOUNTER — Encounter: Payer: Self-pay | Admitting: Internal Medicine

## 2021-10-04 ENCOUNTER — Ambulatory Visit: Payer: BC Managed Care – PPO | Admitting: Internal Medicine

## 2022-03-18 ENCOUNTER — Other Ambulatory Visit: Payer: Self-pay

## 2022-03-18 ENCOUNTER — Encounter (HOSPITAL_COMMUNITY): Payer: Self-pay

## 2022-03-18 ENCOUNTER — Emergency Department (HOSPITAL_BASED_OUTPATIENT_CLINIC_OR_DEPARTMENT_OTHER): Payer: 59

## 2022-03-18 ENCOUNTER — Encounter (HOSPITAL_BASED_OUTPATIENT_CLINIC_OR_DEPARTMENT_OTHER): Payer: Self-pay

## 2022-03-18 ENCOUNTER — Observation Stay (HOSPITAL_BASED_OUTPATIENT_CLINIC_OR_DEPARTMENT_OTHER)
Admission: EM | Admit: 2022-03-18 | Discharge: 2022-03-20 | Disposition: A | Discharge status: Home / Self Care | Payer: 59 | Attending: Internal Medicine | Admitting: Internal Medicine

## 2022-03-18 DIAGNOSIS — Z79899 Other long term (current) drug therapy: Secondary | ICD-10-CM | POA: Diagnosis not present

## 2022-03-18 DIAGNOSIS — I4891 Unspecified atrial fibrillation: Secondary | ICD-10-CM | POA: Diagnosis not present

## 2022-03-18 DIAGNOSIS — Z1152 Encounter for screening for COVID-19: Secondary | ICD-10-CM | POA: Insufficient documentation

## 2022-03-18 DIAGNOSIS — I10 Essential (primary) hypertension: Secondary | ICD-10-CM | POA: Diagnosis not present

## 2022-03-18 DIAGNOSIS — N179 Acute kidney failure, unspecified: Secondary | ICD-10-CM | POA: Insufficient documentation

## 2022-03-18 DIAGNOSIS — J101 Influenza due to other identified influenza virus with other respiratory manifestations: Secondary | ICD-10-CM | POA: Insufficient documentation

## 2022-03-18 DIAGNOSIS — R002 Palpitations: Secondary | ICD-10-CM | POA: Diagnosis present

## 2022-03-18 DIAGNOSIS — Z85828 Personal history of other malignant neoplasm of skin: Secondary | ICD-10-CM | POA: Diagnosis not present

## 2022-03-18 LAB — CBC
HCT: 50.1 % — ABNORMAL HIGH (ref 36.0–46.0)
Hemoglobin: 16.7 g/dL — ABNORMAL HIGH (ref 12.0–15.0)
MCH: 29.3 pg (ref 26.0–34.0)
MCHC: 33.3 g/dL (ref 30.0–36.0)
MCV: 87.9 fL (ref 80.0–100.0)
Platelets: 309 10*3/uL (ref 150–400)
RBC: 5.7 MIL/uL — ABNORMAL HIGH (ref 3.87–5.11)
RDW: 13.2 % (ref 11.5–15.5)
WBC: 4.6 10*3/uL (ref 4.0–10.5)
nRBC: 0 % (ref 0.0–0.2)

## 2022-03-18 LAB — BASIC METABOLIC PANEL
Anion gap: 13 (ref 5–15)
BUN: 18 mg/dL (ref 6–20)
CO2: 21 mmol/L — ABNORMAL LOW (ref 22–32)
Calcium: 9.6 mg/dL (ref 8.9–10.3)
Chloride: 103 mmol/L (ref 98–111)
Creatinine, Ser: 1.87 mg/dL — ABNORMAL HIGH (ref 0.44–1.00)
GFR, Estimated: 31 mL/min — ABNORMAL LOW (ref >60)
Glucose, Bld: 124 mg/dL — ABNORMAL HIGH (ref 70–99)
Potassium: 4.1 mmol/L (ref 3.5–5.1)
Sodium: 137 mmol/L (ref 135–145)

## 2022-03-18 LAB — HEPATIC FUNCTION PANEL
ALT: 23 U/L (ref 0–44)
AST: 27 U/L (ref 15–41)
Albumin: 4.2 g/dL (ref 3.5–5.0)
Alkaline Phosphatase: 75 U/L (ref 38–126)
Bilirubin, Direct: 0.1 mg/dL (ref 0.0–0.2)
Indirect Bilirubin: 0.2 mg/dL — ABNORMAL LOW (ref 0.3–0.9)
Total Bilirubin: 0.3 mg/dL (ref 0.3–1.2)
Total Protein: 7.3 g/dL (ref 6.5–8.1)

## 2022-03-18 LAB — RESP PANEL BY RT-PCR (RSV, FLU A&B, COVID)  RVPGX2
Influenza A by PCR: POSITIVE — AB
Influenza B by PCR: NEGATIVE
Resp Syncytial Virus by PCR: NEGATIVE
SARS Coronavirus 2 by RT PCR: NEGATIVE

## 2022-03-18 LAB — TROPONIN I (HIGH SENSITIVITY)
Troponin I (High Sensitivity): 30 ng/L — ABNORMAL HIGH (ref <18)
Troponin I (High Sensitivity): 67 ng/L — ABNORMAL HIGH (ref <18)

## 2022-03-18 MED ORDER — DILTIAZEM HCL-DEXTROSE 125-5 MG/125ML-% IV SOLN (PREMIX)
5.0000 mg/h | INTRAVENOUS | Status: DC
  Administered 2022-03-18: 5 mg/h via INTRAVENOUS
  Filled 2022-03-18: qty 125

## 2022-03-18 MED ORDER — LACTATED RINGERS IV BOLUS
1000.0000 mL | Freq: Once | INTRAVENOUS | Status: AC
  Administered 2022-03-18: 1000 mL via INTRAVENOUS

## 2022-03-18 MED ORDER — ACETAMINOPHEN 500 MG PO TABS
1000.0000 mg | ORAL_TABLET | Freq: Once | ORAL | Status: AC
  Administered 2022-03-18: 1000 mg via ORAL
  Filled 2022-03-18: qty 2

## 2022-03-18 MED ORDER — MAGNESIUM SULFATE 2 GM/50ML IV SOLN
2.0000 g | Freq: Once | INTRAVENOUS | Status: AC
  Administered 2022-03-18: 2 g via INTRAVENOUS
  Filled 2022-03-18: qty 50

## 2022-03-18 MED ORDER — SODIUM CHLORIDE 0.9 % IV BOLUS
1000.0000 mL | Freq: Once | INTRAVENOUS | Status: AC
  Administered 2022-03-18: 1000 mL via INTRAVENOUS

## 2022-03-18 MED ORDER — SODIUM CHLORIDE 0.9 % IV SOLN: INTRAVENOUS | Status: DC | PRN

## 2022-03-18 MED ORDER — HEPARIN (PORCINE) 25000 UT/250ML-% IV SOLN
1100.0000 [IU]/h | INTRAVENOUS | Status: AC
  Administered 2022-03-18: 1200 [IU]/h via INTRAVENOUS
  Administered 2022-03-19: 1100 [IU]/h via INTRAVENOUS
  Filled 2022-03-18 (×2): qty 250

## 2022-03-18 MED ORDER — DILTIAZEM LOAD VIA INFUSION
20.0000 mg | Freq: Once | INTRAVENOUS | Status: AC
  Administered 2022-03-18: 20 mg via INTRAVENOUS
  Filled 2022-03-18: qty 20

## 2022-03-18 MED ORDER — HEPARIN BOLUS VIA INFUSION
5000.0000 [IU] | Freq: Once | INTRAVENOUS | Status: AC
  Administered 2022-03-18: 5000 [IU] via INTRAVENOUS

## 2022-03-18 MED ORDER — FLUTICASONE PROPIONATE 50 MCG/ACT NA SUSP
1.0000 | Freq: Every day | NASAL | Status: DC
  Administered 2022-03-18: 1 via NASAL
  Filled 2022-03-18: qty 32

## 2022-03-18 NOTE — ED Notes (Signed)
20 mg Cardizem bolus complete.

## 2022-03-18 NOTE — ED Notes (Signed)
Dr. Oswald Hillock aware of elevated troponin 67

## 2022-03-18 NOTE — Progress Notes (Signed)
Plan of Care Note for accepted transfer   Patient: Deanna Choi MRN: [id number]   [place]: 03/18/2022  Facility requesting transfer: [facility]  Requesting Provider: Dr. Oswald Hillock   Reason for transfer: new a fib with RVR   Facility course: 62 yr old lady with BMI 36 and recent ear infection presents with palpitations and URI sxs and is found to be in new a fib with HR as high as 180s. Labs notable for positive flu A pcr, SCr 1.87, Hgb 16.7, and initial trop of 30.   Cardiology was consulted by ED physician, IV heparin was started, and she was given 2 liters IVF and 20 mg IV diltiazem load followed by diltiazem infusion.   Plan of care: The patient is accepted for admission to Progressive unit, at [facility].   Author: Vianne Bulls, MD 03/18/2022  Check [email or web address] for on-call coverage.  Nursing staff, Please call [place] number on Amion as soon as patient's arrival, so appropriate admitting provider can evaluate the pt.

## 2022-03-18 NOTE — Progress Notes (Signed)
ANTICOAGULATION CONSULT NOTE - Initial Consult  Pharmacy Consult for heparin Indication: atrial fibrillation  No Known Allergies  Patient Measurements: Height: '5\' 6"'$  (167.6 cm) Weight: 100.7 kg (222 lb 0.1 oz) IBW/kg (Calculated) : 59.3 Heparin Dosing Weight: 82kg  Vital Signs: Temp: 97.8 F (36.6 C) (01/13 2117) Temp Source: Oral (01/13 2117) BP: 112/66 (01/13 2111) Pulse Rate: 82 (01/13 2111)  Labs: Recent Labs    03/18/22 1952 03/18/22 2025  HGB 16.7*  --   HCT 50.1*  --   PLT 309  --   CREATININE 1.87*  --   TROPONINIHS  --  30*    Estimated Creatinine Clearance: 38.8 mL/min (A) (by C-G formula based on SCr of 1.87 mg/dL (H)).   Medical History: Past Medical History:  Diagnosis Date   Allergy    Diverticulosis    gastric ulcers    Glaucoma    Headache    Hypertension    Hypomagnesemia    Obesity    Skin cancer     Assessment: 72 YOF presenting with palpitations, in afib, she is not on anticoagulation PTA  Goal of Therapy:  Heparin level 0.3-0.7 units/ml Monitor platelets by anticoagulation protocol: Yes   Plan:  Heparin 5000 units IV x 1, and gtt at 1200 units/hr F/u 6 hour heparin level F/u long term [facility] plan  Bertis Ruddy, PharmD, [place] Pharmacist ED Pharmacist Phone # [phone] 03/18/2022 9:44 PM

## 2022-03-18 NOTE — ED Provider Notes (Signed)
[place] [facility] EMERGENCY DEPT Provider Note   CSN: [id number] Arrival date & time: 03/18/22  1909     History Chief Complaint  Patient presents with   Palpitations    HPI Deanna Choi is a 62 y.o. female presenting for palpitations and near syncope.  She is an otherwise healthy 62 year old female coming in with a chief complaint of palpitations and malaise today.  Endorses approximately 4 hours of these palpitations.  She is otherwise ambulatory tolerating p.o. intake.  No known sick contacts denies fevers or chills, nausea vomiting shortness of breath.  Otherwise recently started on antibiotics for an ear infection.  Patient's recorded medical, surgical, social, medication list and allergies were reviewed in the Snapshot window as part of the initial history.   Review of Systems   Review of Systems  Constitutional:  Positive for fatigue. Negative for chills and fever.  HENT:  Negative for ear pain and sore throat.   Eyes:  Negative for pain and visual disturbance.  Respiratory:  Negative for cough and shortness of breath.   Cardiovascular:  Positive for palpitations. Negative for chest pain.  Gastrointestinal:  Negative for abdominal pain and vomiting.  Genitourinary:  Negative for dysuria and hematuria.  Musculoskeletal:  Negative for arthralgias and back pain.  Skin:  Negative for color change and rash.  Neurological:  Negative for seizures and syncope.  All other systems reviewed and are negative.   Physical Exam Updated Vital Signs BP 112/66   Pulse 82   Temp 97.8 F (36.6 C) (Oral)   Resp 17   Ht '5\' 6"'$  (1.676 m)   Wt 100.7 kg   SpO2 96%   BMI 35.83 kg/m  Physical Exam Vitals and nursing note reviewed.  Constitutional:      General: She is not in acute distress.    Appearance: She is well-developed.  HENT:     Head: Normocephalic and atraumatic.  Eyes:     Conjunctiva/sclera: Conjunctivae normal.  Cardiovascular:     Rate and Rhythm:  Normal rate and regular rhythm.     Heart sounds: No murmur heard. Pulmonary:     Effort: Pulmonary effort is normal. No respiratory distress.     Breath sounds: Normal breath sounds.  Abdominal:     General: There is no distension.     Palpations: Abdomen is soft.     Tenderness: There is no abdominal tenderness. There is no right CVA tenderness or left CVA tenderness.  Musculoskeletal:        General: No swelling or tenderness. Normal range of motion.     Cervical back: Neck supple.  Skin:    General: Skin is warm and dry.  Neurological:     General: No focal deficit present.     Mental Status: She is alert and oriented to person, place, and time. Mental status is at baseline.     Cranial Nerves: No cranial nerve deficit.      ED Course/ Medical Decision Making/ A&P Clinical Course as of 03/18/22 2315  Sat Mar 18, 2022  2125 Leave her on infusion tonight.  [CC]    Clinical Course User Index [CC] Tretha Sciara, MD    Procedures .Critical Care  Performed by: Tretha Sciara, MD Authorized by: Tretha Sciara, MD   Critical care provider statement:    Critical care time (minutes):  30   Critical care was necessary to treat or prevent imminent or life-threatening deterioration of the following conditions:  Cardiac failure and circulatory failure  Critical care was time spent personally by me on the following activities:  Development of treatment plan with patient or surrogate, discussions with consultants, evaluation of patient's response to treatment, examination of patient, ordering and review of laboratory studies, ordering and review of radiographic studies, ordering and performing treatments and interventions, pulse oximetry, re-evaluation of patient's condition and review of old charts Ultrasound ED Echo  Date/Time: 03/18/2022 11:14 PM  Performed by: Tretha Sciara, MD Authorized by: Tretha Sciara, MD   Procedure details:    Indications: hypotension      Views: subxiphoid and parasternal long axis view     Images: archived     Limitations:  Body habitus Findings:    Pericardium: no pericardial effusion     LV Function: normal (>50% EF)     IVC: dilated      Medications Ordered in ED Medications  diltiazem (CARDIZEM) 125 mg in dextrose 5% 125 mL (1 mg/mL) infusion (5 mg/hr Intravenous Rate/Dose Change 03/18/22 2046)  0.9 %  sodium chloride infusion (0 mLs Intravenous Stopped 03/18/22 2013)  fluticasone (FLONASE) 50 MCG/ACT nasal spray 1 spray (1 spray Each Nare Given 03/18/22 2130)  heparin ADULT infusion 100 units/mL (25000 units/230m) (1,200 Units/hr Intravenous New Bag/Given 03/18/22 2229)  acetaminophen (TYLENOL) tablet 1,000 mg (has no administration in time range)  sodium chloride 0.9 % bolus 1,000 mL (0 mLs Intravenous Stopped 03/18/22 2101)  diltiazem (CARDIZEM) 1 mg/mL load via infusion 20 mg (20 mg Intravenous Bolus from Bag 03/18/22 2014)  magnesium sulfate IVPB 2 g 50 mL (0 g Intravenous Stopped 03/18/22 2133)  lactated ringers bolus 1,000 mL (0 mLs Intravenous Stopped 03/18/22 2223)  heparin bolus via infusion 5,000 Units (5,000 Units Intravenous Bolus from Bag 03/18/22 2226)    Medical Decision Making:    Deanna KIMBALLis a 62y.o. female who presented to the ED today with multiple complaints detailed above.     Additional history discussed with patient's family/caregivers.  Patient placed on continuous vitals and telemetry monitoring while in ED which was reviewed periodically.   Complete initial physical exam performed, notably the patient  was emergently tachycardic at 180s.  I was called emergently to bedside for further evaluation and management..      Reviewed and confirmed nursing documentation for past medical history, family history, social history.    Initial Assessment:   Patient with critical tachycardia 180s that initially appeared to be sinus rhythm and IV fluid resuscitation was emergently started with  2 high flow IVs. Heart rate did not change and point-of-care ultrasound was done as above that demonstrated normal ejection fraction and a profoundly full inferior vena cava. Repeat EKG was done that now appears to be more consistent with atrial fibrillation with rapid ventricular response.  Patient has no history of this.  Discussed cardioversion as a potential intervention given her blood pressure of 90/60, however she appears well-perfused.  I believe that her perfusion would improve with rate control and we had a shared medical decision making and patient consented to diltiazem bolus given normal-appearing ejection fraction on point-of-care echo.  She also consented to administration of medications despite risk of stroke. Diltiazem bolus was completed and patient converted to normal sinus rhythm and had restoration of normal heart rates. Perfusion improved and patient stabilized.  Objective evaluation for potential underlying cause was initiated at this point including evaluation for ACS, pulmonary embolism, aortic dissection, pneumonia, pneumothorax, infection.  In particular pulmonary embolism is considered less likely due to low Wells score  based on patient's risk factors.   Initial Plan:  Viral screening to evaluate for infectious etiology of patient's symptoms Screening labs including CBC and Metabolic panel to evaluate for infectious or metabolic etiology of disease.  Urinalysis with reflex culture ordered to evaluate for UTI or relevant urologic/nephrologic pathology.  CXR to evaluate for structural/infectious intrathoracic pathology.  EKG to evaluate for cardiac pathology. Objective evaluation as below reviewed with plan for close reassessment  Initial Study Results:   Laboratory  Multifocal laboratory abnormalities appreciated including decreased GFR likely volume related, influenza A positivity likely etiology of her RVR and initial presentation.  EKG Demonstrates atrial  fibrillation with rapid ventricular response.  Radiology  All images reviewed independently. Agree with radiology report at this time.   No results found.   Consults:  Case discussed with Cardiology on-call and hospitalist on-call.   Final Assessment and Plan:   Cardiology felt comfortable with continuation of diltiazem drip overnight until formal echo is achieved that demonstrates normal ejection fraction.  Hospitalist agreed with need for admission and patient arranged for admission for further care and management.   Disposition:   Based on the above findings, I believe this patient is stable for admission.    Patient/family educated about specific findings on our evaluation and explained exact reasons for admission.  Patient/family educated about clinical situation and time was allowed to answer questions.   Admission team communicated with and agreed with need for admission. Patient admitted. Patient ready to move at this time.     Emergency Department Medication Summary:   Medications  diltiazem (CARDIZEM) 125 mg in dextrose 5% 125 mL (1 mg/mL) infusion (5 mg/hr Intravenous Rate/Dose Change 03/18/22 2046)  0.9 %  sodium chloride infusion (0 mLs Intravenous Stopped 03/18/22 2013)  fluticasone (FLONASE) 50 MCG/ACT nasal spray 1 spray (1 spray Each Nare Given 03/18/22 2130)  heparin ADULT infusion 100 units/mL (25000 units/262m) (1,200 Units/hr Intravenous New Bag/Given 03/18/22 2229)  acetaminophen (TYLENOL) tablet 1,000 mg (has no administration in time range)  sodium chloride 0.9 % bolus 1,000 mL (0 mLs Intravenous Stopped 03/18/22 2101)  diltiazem (CARDIZEM) 1 mg/mL load via infusion 20 mg (20 mg Intravenous Bolus from Bag 03/18/22 2014)  magnesium sulfate IVPB 2 g 50 mL (0 g Intravenous Stopped 03/18/22 2133)  lactated ringers bolus 1,000 mL (0 mLs Intravenous Stopped 03/18/22 2223)  heparin bolus via infusion 5,000 Units (5,000 Units Intravenous Bolus from Bag 03/18/22 2226)          Clinical Impression: No diagnosis found.   Admit   Final Clinical Impression(s) / ED Diagnoses Final diagnoses:  None    Rx / DC Orders ED Discharge Orders     None         CTretha Sciara MD 03/18/22 2315

## 2022-03-19 DIAGNOSIS — Z85828 Personal history of other malignant neoplasm of skin: Secondary | ICD-10-CM | POA: Diagnosis not present

## 2022-03-19 DIAGNOSIS — I4891 Unspecified atrial fibrillation: Secondary | ICD-10-CM | POA: Diagnosis not present

## 2022-03-19 DIAGNOSIS — N179 Acute kidney failure, unspecified: Secondary | ICD-10-CM | POA: Diagnosis not present

## 2022-03-19 DIAGNOSIS — I10 Essential (primary) hypertension: Secondary | ICD-10-CM | POA: Diagnosis not present

## 2022-03-19 DIAGNOSIS — R002 Palpitations: Secondary | ICD-10-CM | POA: Diagnosis present

## 2022-03-19 DIAGNOSIS — J101 Influenza due to other identified influenza virus with other respiratory manifestations: Secondary | ICD-10-CM | POA: Diagnosis not present

## 2022-03-19 DIAGNOSIS — Z79899 Other long term (current) drug therapy: Secondary | ICD-10-CM | POA: Diagnosis not present

## 2022-03-19 DIAGNOSIS — Z1152 Encounter for screening for COVID-19: Secondary | ICD-10-CM | POA: Diagnosis not present

## 2022-03-19 LAB — HEPARIN LEVEL (UNFRACTIONATED)
Heparin Unfractionated: 0.85 IU/mL — ABNORMAL HIGH (ref 0.30–0.70)
Heparin Unfractionated: >1.1 IU/mL — ABNORMAL HIGH (ref 0.30–0.70)
Heparin Unfractionated: 0.5 IU/mL (ref 0.30–0.70)

## 2022-03-19 MED ORDER — ALBUTEROL SULFATE (2.5 MG/3ML) 0.083% IN NEBU: 2.5000 mg | INHALATION_SOLUTION | RESPIRATORY_TRACT | Status: DC | PRN

## 2022-03-19 MED ORDER — SODIUM CHLORIDE 0.9 % IV SOLN: INTRAVENOUS | Status: DC

## 2022-03-19 MED ORDER — ZOLPIDEM TARTRATE 5 MG PO TABS: 5.0000 mg | ORAL_TABLET | Freq: Every evening | ORAL | Status: DC | PRN

## 2022-03-19 MED ORDER — ACETAMINOPHEN 325 MG PO TABS
650.0000 mg | ORAL_TABLET | Freq: Four times a day (QID) | ORAL | Status: DC | PRN
  Administered 2022-03-19: 650 mg via ORAL
  Filled 2022-03-19: qty 2

## 2022-03-19 MED ORDER — OXYCODONE HCL 5 MG PO TABS: 5.0000 mg | ORAL_TABLET | ORAL | Status: DC | PRN

## 2022-03-19 MED ORDER — ALBUTEROL SULFATE (2.5 MG/3ML) 0.083% IN NEBU
2.5000 mg | INHALATION_SOLUTION | Freq: Four times a day (QID) | RESPIRATORY_TRACT | Status: DC
  Administered 2022-03-19: 2.5 mg via RESPIRATORY_TRACT
  Filled 2022-03-19: qty 3

## 2022-03-19 MED ORDER — HYDRALAZINE HCL 20 MG/ML IJ SOLN: 10.0000 mg | Freq: Four times a day (QID) | INTRAMUSCULAR | Status: DC | PRN

## 2022-03-19 MED ORDER — PANTOPRAZOLE SODIUM 40 MG PO TBEC: 40.0000 mg | DELAYED_RELEASE_TABLET | Freq: Every day | ORAL | Status: DC

## 2022-03-19 MED ORDER — AMOXICILLIN 250 MG PO CHEW: 500.0000 mg | CHEWABLE_TABLET | Freq: Three times a day (TID) | ORAL | Status: DC

## 2022-03-19 MED ORDER — AMOXICILLIN 250 MG/5ML PO SUSR
500.0000 mg | Freq: Three times a day (TID) | ORAL | Status: DC
  Administered 2022-03-19 – 2022-03-20 (×3): 500 mg via ORAL
  Filled 2022-03-19 (×4): qty 10

## 2022-03-19 MED ORDER — APIXABAN 5 MG PO TABS
5.0000 mg | ORAL_TABLET | Freq: Two times a day (BID) | ORAL | Status: DC
  Administered 2022-03-19 – 2022-03-20 (×2): 5 mg via ORAL
  Filled 2022-03-19 (×2): qty 1

## 2022-03-19 NOTE — ED Notes (Signed)
Pt. Remains alert and comfortable. Report attempted to [facility], nurse is at lunch and will call back.

## 2022-03-19 NOTE — ED Notes (Signed)
Called [facility] for transport to [employer] [facility] '@2'$ :43pm.  Spoke with Iona Beard

## 2022-03-19 NOTE — ED Notes (Signed)
I just had our lab notify the courier re: unfractionated heparin level. They will inform me of the courier's arrival.

## 2022-03-19 NOTE — Progress Notes (Signed)
[place] for heparin > eliquis  Indication: atrial fibrillation   No Known Allergies  Patient Measurements: Height: '5\' 6"'$  (167.6 cm) Weight: 101.4 kg (223 lb 9.6 oz) IBW/kg (Calculated) : 59.3 Heparin Dosing Weight: 82kg  Vital Signs: Temp: 98.7 F (37.1 C) (01/14 1634) Temp Source: Oral (01/14 1634) BP: 147/80 (01/14 1634) Pulse Rate: 68 (01/14 1634)  Labs: Recent Labs    03/18/22 1952 03/18/22 2025 03/18/22 2159 03/19/22 0500 03/19/22 1516 03/19/22 1655  HGB 16.7*  --   --   --   --   --   HCT 50.1*  --   --   --   --   --   PLT 309  --   --   --   --   --   HEPARINUNFRC  --   --   --  0.85* >1.10* 0.50  CREATININE 1.87*  --   --   --   --   --   TROPONINIHS  --  30* 67*  --   --   --      Estimated Creatinine Clearance: 38.9 mL/min (A) (by C-G formula based on SCr of 1.87 mg/dL (H)).  Assessment: 62 y.o. female with Afib on heparin infusion. Pharmacy consulted to transition from heparin gtt to apixaban (eliquis) for therapeutic anticoagulation  62 yo ; 101 kg ; Scr 1.8  CBC: Hgb 16.7, Plt 309   Goal of Therapy:  Heparin level 0.3-0.7 units/ml Monitor platelets by anticoagulation protocol: Yes   Plan:  Stop heparin drip at time of first eliquis dose Begin eliquis '5mg'$  BID  F/u s/sx bleeding, daily CBC -may benefit from copay check for eliquis   Wilson Singer, PharmD Clinical Pharmacist 03/19/2022 5:56 PM

## 2022-03-19 NOTE — Discharge Instructions (Signed)

## 2022-03-19 NOTE — H&P (Signed)
[facility] History and Physical  CAROLEE CHANNELL ZCH:[id number] DOB: 05/14/1960 DOA: 03/18/2022 PCP: Fredrich Romans, PA  Admitted from: Home Chief Complaint: Palpitation, near syncope  History of Present Illness: Deanna Choi is a 62 y.o. female with PMH significant for HTN, obesity, diverticulosis, gastric ulcers 1/13, patient presented to the ED at [facility] with complaint of palpitation and a near syncopal episode.  Started onset of symptoms about 4 hours prior to presentation. Of note, 1/11, patient was seen at urgent care center for URI symptoms for few days.  Patient states he got COVID test done which was negative but does not recall pending flu test done.  She was suspected of sinus infection and was started on a course of oral amoxicillin for 10 days. Dry cough was her primary symptom and she has been taking benzonatate.  On arrival, she was afebrile, heart rate 180s EKG with A-fib with RVR.  No prior history of A-fib Initial blood pressure was low at 90/60 which improved after IV fluid hydration EDP discussed the case with cardiologist on-call and started the patient on Cardizem bolus and drip.  Anticoagulation was started with heparin drip. Around 3 AM last night, heart rhythm changed to normal sinus and Cardizem drip was stopped. Initial troponin was elevated to 30, creatinine elevated 1.87 Family however wanted inpatient management Of note, patient was also found to be positive for influenza A.  Patient waited for more than 18 hours in the ED before she got bed available at [facility]. At the time of my evaluation this afternoon, patient was lying on bed.  Not in distress other than bouts of dry cough.  Family at bedside. On heparin drip.  Review of Systems:  All systems were reviewed and were negative unless otherwise mentioned in the HPI   Past medical history: Past Medical History:  Diagnosis Date   Allergy    Diverticulosis    gastric ulcers     Glaucoma    Headache    Hypertension    Hypomagnesemia    Obesity    Skin cancer     Past surgical history: Past Surgical History:  Procedure Laterality Date   KNEE SURGERY     SALIVARY GLAND SURGERY     SQUAMOUS CELL CARCINOMA EXCISION     UPPER GASTROINTESTINAL ENDOSCOPY  06/2021   VAGINAL HYSTERECTOMY  2009    Social History:  reports that she has never smoked. She has never used smokeless tobacco. She reports that she does not currently use alcohol. She reports that she does not use drugs.  Allergies:  No Known Allergies Patient has no known allergies.   Family history:  Family History  Problem Relation Age of Onset   Hypertension Mother    Colon polyps Mother    Hypertension Father    Stroke Father    Irritable bowel syndrome Father    Heart disease Brother        CABG- secondary to heart disease   Hypertension Other    Colon cancer Neg Hx    Esophageal cancer Neg Hx    Stomach cancer Neg Hx    Pancreatic cancer Neg Hx    Liver cancer Neg Hx    Rectal cancer Neg Hx      Home Meds: Prior to Admission medications   Medication Sig Start Date End Date Taking? Authorizing Provider  Cholecalciferol (VITAMIN D) 125 MCG (5000 UT) CAPS Take 1 capsule by mouth daily.    [provider]  Multiple Vitamin (  MULTIVITAMIN) tablet Take 1 tablet by mouth daily.    [provider]  omeprazole (PRILOSEC) 40 MG capsule Take 1 capsule (40 mg total) by mouth 2 (two) times daily. 06/22/21 09/14/21  Sharyn Creamer, MD  Zinc Sulfate (ZINC 15 PO) Take 1 tablet by mouth daily.    [provider]    Physical Exam: Vitals:   03/19/22 1500 03/19/22 1530 03/19/22 1541 03/19/22 1634  BP:  127/68  (!) 147/80  Pulse: 65 70  68  Resp: '13 15  18  '$ Temp:   98.6 F (37 C) 98.7 F (37.1 C)  TempSrc:   Oral Oral  SpO2: 97% 97%    Weight:    101.4 kg  Height:    '5\' 6"'$  (1.676 m)   Wt Readings from Last 3 Encounters:  03/19/22 101.4 kg  08/29/21 100.7 kg   06/22/21 100.7 kg   Body mass index is 36.09 kg/m.  General exam: Pleasant, middle-aged Caucasian female.  Not in physical distress Skin: No rashes, lesions or ulcers. HEENT: Atraumatic, normocephalic, no obvious bleeding Lungs: Clear to auscultation bilaterally.  Mild cough on deep breathing CVS: Regular rate and rhythm, no murmur GI/Abd soft, nontender, nondistended, bowel sound present CNS: Alert, awake, oriented x 3 Psychiatry: Mood appropriate Extremities: No pedal edema, no calf tenderness     Consult Orders  (From admission, onward)           Start     Ordered   03/18/22 2132  Consult to hospitalist  Paged by Jerene Pitch  Once       Provider:  (Not yet assigned)  Question Answer Comment  Place call to: [facility]   Reason for Consult Admit      03/18/22 2131            Labs on Admission:   CBC: Recent Labs  Lab 03/18/22 1952  WBC 4.6  HGB 16.7*  HCT 50.1*  MCV 87.9  PLT 712    Basic Metabolic Panel: Recent Labs  Lab 03/18/22 1952  NA 137  K 4.1  CL 103  CO2 21*  GLUCOSE 124*  BUN 18  CREATININE 1.87*  CALCIUM 9.6    Liver Function Tests: Recent Labs  Lab 03/18/22 1952  AST 27  ALT 23  ALKPHOS 75  BILITOT 0.3  PROT 7.3  ALBUMIN 4.2   No results for input(s): "LIPASE", "AMYLASE" in the last 168 hours. No results for input(s): "AMMONIA" in the last 168 hours.  Cardiac Enzymes: No results for input(s): "CKTOTAL", "CKMB", "CKMBINDEX", "TROPONINI" in the last 168 hours.  BNP (last 3 results) No results for input(s): "BNP" in the last 8760 hours.  ProBNP (last 3 results) No results for input(s): "PROBNP" in the last 8760 hours.  CBG: No results for input(s): "GLUCAP" in the last 168 hours.  Lipase     Component Value Date/Time   LIPASE 34 07/24/2017 2123     Urinalysis    Component Value Date/Time   COLORURINE STRAW (A) 07/24/2017 2124   APPEARANCEUR CLEAR 07/24/2017 2124   LABSPEC 1.009 07/24/2017 2124    PHURINE 5.0 07/24/2017 2124   GLUCOSEU NEGATIVE 07/24/2017 2124   HGBUR NEGATIVE 07/24/2017 2124   [place] NEGATIVE 07/24/2017 2124   [place] NEGATIVE 07/24/2017 2124   PROTEINUR NEGATIVE 07/24/2017 2124   NITRITE NEGATIVE 07/24/2017 2124   LEUKOCYTESUR NEGATIVE 07/24/2017 2124     Drugs of Abuse  No results found for: "LABOPIA", "COCAINSCRNUR", "LABBENZ", "AMPHETMU", "THCU", "LABBARB"  Radiological Exams on Admission: DG Chest Portable 1 View  Result Date: 03/18/2022 CLINICAL DATA:  Palpitations EXAM: PORTABLE CHEST 1 VIEW COMPARISON:  07/24/2017 FINDINGS: The heart size and mediastinal contours are within normal limits. Both lungs are clear. The visualized skeletal structures are unremarkable. IMPRESSION: No active disease. Electronically Signed   By: Rolm Baptise M.D.   On: 03/18/2022 20:05     ------------------------------------------------------------------------------------------------------ Assessment/Plan: Principal Problem:   Atrial fibrillation with RVR (HCC)  A-fib with RVR New onset A-fib Presents with few hours of palpitation, shortness of breath Initial EKG with heart rate more than 180. Started on Cardizem bolus and drip after which heart rate converted to normal sinus rhythm in few hours. Previously currently she is not on any AV nodal blocking agent. Continue to monitor in telemetry CHA2DS2-VASc score 2 (female, HTN).  Started on heparin drip in the ED. Switch to oral Eliquis. Echocardiogram ordered.  Influenza A In retrospect, it feels like the URI symptoms she was having last week for which she went to urgent care center was probably due to flu.  She states she was checked negative for COVID but flu test was not done.  Cough improving with benzonatate.  She was also started on a course of amoxicillin for 10 days.  Since he is improving clinically, we can let her take it for next 2 days to complete 5-day course Check ambulatory requirement. Since it  has been about 5 days since her last symptoms, I do not think starting Tamiflu would be necessary at this time.  Patient is not interested in initiating it anyway.  AKI Creatinine elevated to 1.87.  Last available creatinine was normal in 2017.  Unclear if she had progressive CKD. IV hydration overnight. Recent Labs    03/18/22 1952  BUN 18  CREATININE 1.87*   Hypertension Initially hypotensive in the ED because of RVR Blood pressure normal for last several hours after rhythm conversion. Not on any blood pressure medicines.  Continue to monitor  History of gastric ulcers, diverticulosis She had EGD done in April 2023, found to have nonbleeding gastric ulcers.  She was started on a course of Protonix in June showed resolution gastric ulcers.  She is not on Protonix since then.  Hemoglobin stable. Recent Labs    03/18/22 1952  HGB 16.7*  MCV 87.9    Mobility: Encourage ambulation.  Check amatoxin requirement  Goals of care   Code Status: Full Code   Diet: Diet Order             Diet Heart Room service appropriate? Yes; Fluid consistency: Thin  Diet effective now                   Nutritional status:  Body mass index is 36.09 kg/m.       DVT prophylaxis: Eliquis    Antimicrobials: 2 more days of amoxicillin Fluid: NS at 75 mill per hour Consultants: None Family Communication: Family at bedside Dispo: The patient is from: Home              Anticipated d/c is to: Home              Anticipated d/c date is: Hopefully home tomorrow  ------------------------------------------------------------------------------------- Severity of Illness: The appropriate patient status for this patient is OBSERVATION. Observation status is judged to be reasonable and necessary in order to provide the required intensity of service to ensure the patient's safety. The patient's presenting symptoms, physical exam findings, and initial radiographic  and laboratory data in the context  of their medical condition is felt to place them at decreased risk for further clinical deterioration. Furthermore, it is anticipated that the patient will be medically stable for discharge from the hospital within 2 midnights of admission.   Signed, Terrilee Croak, MD [facility] 03/19/2022

## 2022-03-19 NOTE — ED Notes (Signed)
Blood test sent at this time via courier.

## 2022-03-19 NOTE — ED Notes (Signed)
I have just phoned report to Belenda Cruise, Therapist, sports at [facility].

## 2022-03-19 NOTE — Progress Notes (Signed)
[facility] for heparin Indication: atrial fibrillation Brief A/P: Heparin level supratherapeutic Decrease Heparin rate  No Known Allergies  Patient Measurements: Height: '5\' 6"'$  (167.6 cm) Weight: 100.7 kg (222 lb 0.1 oz) IBW/kg (Calculated) : 59.3 Heparin Dosing Weight: 82kg  Vital Signs: Temp: 98.3 F (36.8 C) (01/14 0300) Temp Source: Oral (01/14 0300) BP: 116/69 (01/14 0400) Pulse Rate: 62 (01/14 0400)  Labs: Recent Labs    03/18/22 1952 03/18/22 2025 03/18/22 2159 03/19/22 0500  HGB 16.7*  --   --   --   HCT 50.1*  --   --   --   PLT 309  --   --   --   HEPARINUNFRC  --   --   --  0.85*  CREATININE 1.87*  --   --   --   TROPONINIHS  --  30* 67*  --      Estimated Creatinine Clearance: 38.8 mL/min (A) (by C-G formula based on SCr of 1.87 mg/dL (H)).  Assessment: 62 y.o. female with Afib for heparin  Goal of Therapy:  Heparin level 0.3-0.7 units/ml Monitor platelets by anticoagulation protocol: Yes   Plan:  Decrease Heparin 1100 units/hr Check heparin level in 8 hours.   Phillis Knack, PharmD, BCPS  03/19/2022 6:32 AM

## 2022-03-20 ENCOUNTER — Observation Stay (HOSPITAL_BASED_OUTPATIENT_CLINIC_OR_DEPARTMENT_OTHER): Payer: 59

## 2022-03-20 ENCOUNTER — Other Ambulatory Visit (HOSPITAL_COMMUNITY): Payer: Self-pay

## 2022-03-20 DIAGNOSIS — R9431 Abnormal electrocardiogram [ECG] [EKG]: Secondary | ICD-10-CM | POA: Diagnosis not present

## 2022-03-20 DIAGNOSIS — I4891 Unspecified atrial fibrillation: Secondary | ICD-10-CM | POA: Diagnosis not present

## 2022-03-20 LAB — BASIC METABOLIC PANEL
Anion gap: 10 (ref 5–15)
BUN: 13 mg/dL (ref 6–20)
CO2: 20 mmol/L — ABNORMAL LOW (ref 22–32)
Calcium: 8.1 mg/dL — ABNORMAL LOW (ref 8.9–10.3)
Chloride: 109 mmol/L (ref 98–111)
Creatinine, Ser: 1.04 mg/dL — ABNORMAL HIGH (ref 0.44–1.00)
GFR, Estimated: >60 mL/min (ref >60)
Glucose, Bld: 82 mg/dL (ref 70–99)
Potassium: 4.2 mmol/L (ref 3.5–5.1)
Sodium: 139 mmol/L (ref 135–145)

## 2022-03-20 LAB — LIPID PANEL
Cholesterol: 117 mg/dL (ref 0–200)
HDL: 36 mg/dL — ABNORMAL LOW (ref >40)
LDL Cholesterol: 62 mg/dL (ref 0–99)
Total CHOL/HDL Ratio: 3.3 RATIO
Triglycerides: 95 mg/dL (ref <150)
VLDL: 19 mg/dL (ref 0–40)

## 2022-03-20 LAB — TROPONIN I (HIGH SENSITIVITY): Troponin I (High Sensitivity): 36 ng/L — ABNORMAL HIGH (ref <18)

## 2022-03-20 LAB — ECHOCARDIOGRAM COMPLETE
AR max vel: 2.59 cm2
AV Area VTI: 2.61 cm2
AV Area mean vel: 2.58 cm2
AV Mean grad: 6 mmHg
AV Peak grad: 11.8 mmHg
Ao pk vel: 1.72 m/s
Area-P 1/2: 3.77 cm2
Height: 66 in
S' Lateral: 3.75 cm
Weight: 3577.6 oz

## 2022-03-20 LAB — CBC
HCT: 40.1 % (ref 36.0–46.0)
Hemoglobin: 13.2 g/dL (ref 12.0–15.0)
MCH: 29.9 pg (ref 26.0–34.0)
MCHC: 32.9 g/dL (ref 30.0–36.0)
MCV: 90.7 fL (ref 80.0–100.0)
Platelets: 224 10*3/uL (ref 150–400)
RBC: 4.42 MIL/uL (ref 3.87–5.11)
RDW: 13.2 % (ref 11.5–15.5)
WBC: 3.4 10*3/uL — ABNORMAL LOW (ref 4.0–10.5)
nRBC: 0 % (ref 0.0–0.2)

## 2022-03-20 LAB — TSH: TSH: 0.956 u[IU]/mL (ref 0.350–4.500)

## 2022-03-20 LAB — HIV ANTIBODY (ROUTINE TESTING W REFLEX): HIV Screen 4th Generation wRfx: NONREACTIVE

## 2022-03-20 MED ORDER — APIXABAN 5 MG PO TABS: 5.0000 mg | ORAL_TABLET | Freq: Two times a day (BID) | ORAL | 2 refills | Status: DC

## 2022-03-20 NOTE — TOC Benefit Eligibility Note (Signed)
Patient Teacher, English as a foreign language completed.    The patient is currently admitted and upon discharge could be taking Eliquis 5 mg.  The current 30 day co-pay is $35.00.   The patient is insured through [place], [place] Patient [place] Patient Advocate Team Direct Number: [phone]  Fax: [phone]

## 2022-03-20 NOTE — Discharge Summary (Signed)
Physician Discharge Summary  RHYTHM GUBBELS YIF:[id number] DOB: 08-Dec-1960 DOA: 03/18/2022  PCP: Fredrich Romans, PA  Admit date: 03/18/2022 Discharge date: 03/20/2022  Admitted From: Home Discharge disposition: Home  Recommendations at discharge:  You been started on Eliquis which is a blood thinner.  Please watch for obvious bleeding or easy bruisability. Follow-up with cardiology as an outpatient.  Brief narrative Deanna Choi is a 62 y.o. female with PMH significant for HTN, obesity, diverticulosis, gastric ulcers 1/13, patient presented to the ED at [facility] with complaint of palpitation and a near syncopal episode.  Started onset of symptoms about 4 hours prior to presentation. Of note, 1/11, patient was seen at urgent care center for URI symptoms for few days.  Patient states he got COVID test done which was negative but does not recall pending flu test done.  She was suspected of sinus infection and was started on a course of oral amoxicillin for 10 days. Dry cough was her primary symptom and she has been taking benzonatate.  On arrival, she was afebrile, heart rate 180s EKG with A-fib with RVR.  No prior history of A-fib Initial blood pressure was low at 90/60 which improved after IV fluid hydration EDP discussed the case with cardiologist on-call and started the patient on Cardizem bolus and drip.  Anticoagulation was started with heparin drip. Around 3 AM last night, heart rhythm changed to normal sinus and Cardizem drip was stopped. Initial troponin was elevated to 30, creatinine elevated 1.87 Family however wanted inpatient management Of note, patient was also found to be positive for influenza A.  Patient waited for more than 18 hours in the ED before she got bed available at [facility].  Subjective: Patient was seen and examined this morning.  Lying on bed.  Not in distress but not on supplemental oxygen.  Able to walk on the hallway without supplemental oxygen and  maintain saturation more than 95% throughout.  Cough improving.  Hospital course: A-fib with RVR New onset A-fib Presented with few hours of palpitation, shortness of breath Initial EKG with heart rate more than 180. Started on Cardizem bolus and drip after which heart rate converted to normal sinus rhythm in few hours after which Cardizem was stopped.  She was not initiated on any scheduled AV nodal blocking agent.  She has not on those at home either. Monitor until [employer].  No recurrence of A-fib for next 24 hours. CHA2DS2-VASc score 2 (female, HTN).  She was initially started on heparin drip in the ED. subsequently switched to oral Eliquis. Echocardiogram unremarkable. Follow-up with cardiology as an outpatient to establish if any need of long-term anticoagulation.  Influenza A In retrospect, it feels like the URI symptoms she was having last week for which she went to urgent care center was probably due to flu.  She states she was checked negative for COVID but flu test was not done.  Cough improving with benzonatate.  She was also started on a course of amoxicillin for 10 days.  Since he is improving clinically, we can let her take it for next 2 days to complete 5-day course On ambulation, patient maintained O2 sat more than 95% on room air without distress. Since it has been about 5 days since her last symptoms, I do not think starting Tamiflu would be necessary at this time.  Patient is not interested in initiating it anyway.  AKI Creatinine was elevated to 1.87.  Last available creatinine was normal in 2017.   Creatinine  improved with overnight hydration. Recent Labs    03/18/22 1952 03/20/22 0155  BUN 18 13  CREATININE 1.87* 1.04*   Hypertension Initially hypotensive in the ED because of RVR Blood pressure normal for last several hours after rhythm conversion. Slightly elevated blood pressure this morning probably because of IV fluid.  We will not initiate on any  antihypertensive at this time.  History of gastric ulcers, diverticulosis She had EGD done in April 2023, found to have nonbleeding gastric ulcers.  She was started on a course of Protonix in June showed resolution gastric ulcers.  She is not on Protonix since then.  Hemoglobin stable. Recent Labs    03/18/22 1952 03/20/22 0155  HGB 16.7* 13.2  MCV 87.9 90.7   Mobility: Encourage ambulation.  Check amatoxin requirement  Goals of care   Code Status: Full Code   Wounds:  -    Discharge Exam:   Vitals:   03/19/22 2007 03/19/22 2156 03/20/22 0540 03/20/22 1047  BP:  (!) 140/74 (!) 159/61 (!) 149/83  Pulse:  67 61 61  Resp:  '18 19 20  '$ Temp:  98.6 F (37 C) 98.6 F (37 C) 98.9 F (37.2 C)  TempSrc:  Oral Oral Oral  SpO2: 97% 98% 96% 97%  Weight:      Height:        Body mass index is 36.09 kg/m.  General exam: Pleasant, middle-aged Caucasian female.  Not in physical distress Skin: No rashes, lesions or ulcers. HEENT: Atraumatic, normocephalic, no obvious bleeding Lungs: Clear to auscultation bilaterally.  Improving cough CVS: Regular rate and rhythm, no murmur GI/Abd soft, nontender, nondistended, bowel sound present CNS: Alert, awake, oriented x 3 Psychiatry: Mood appropriate Extremities: No pedal edema, no calf tenderness  Follow ups:    Follow-up Information     Connect with your PCP/Specialist as discussed. Schedule an appointment as soon as possible for a visit .   Contact information: [email or web address] Call our physician referral line at [phone].        Fredrich Romans, PA Follow up.   Specialty: Physician Assistant Contact information: [place]. [address] [place] [address] [phone]         [facility] Follow up.   Specialty: Cardiology Contact information: [address] [id number] [place] [phone]                 Discharge Instructions:   Discharge Instructions     Call MD for:  difficulty breathing, headache or visual disturbances   Complete by: As directed    Call MD for:  extreme fatigue   Complete by: As directed    Call MD for:  hives   Complete by: As directed    Call MD for:  persistant dizziness or light-headedness   Complete by: As directed    Call MD for:  persistant nausea and vomiting   Complete by: As directed    Call MD for:  severe uncontrolled pain   Complete by: As directed    Call MD for:  temperature >100.4   Complete by: As directed    Diet general   Complete by: As directed    Discharge instructions   Complete by: As directed    Recommendations at discharge:   You been started on Eliquis which is a blood thinner.  Please watch for obvious bleeding or easy bruisability.  Follow-up with cardiology as an outpatient.  General discharge instructions: Follow with Primary  MD Fredrich Romans, PA in 7 days  Please request your PCP  to go over your hospital tests, procedures, radiology results at the follow up. Please get your medicines reviewed and adjusted.  Your PCP may decide to repeat certain labs or tests as needed. Do not drive, operate heavy machinery, perform activities at heights, swimming or participation in water activities or provide baby sitting services if your were admitted for syncope or siezures until you have seen by Primary MD or a Neurologist and advised to do so again. [place] Controlled Substance Reporting System database was reviewed. Do not drive, operate heavy machinery, perform activities at heights, swim, participate in water activities or provide baby-sitting services while on medications for pain, sleep and mood until your outpatient physician has reevaluated you and advised to do so again.  You are strongly recommended to comply with the dose, frequency and duration of prescribed medications. Activity: As tolerated with Full fall  precautions use walker/cane & assistance as needed Avoid using any recreational substances like cigarette, tobacco, alcohol, or non-prescribed drug. If you experience worsening of your admission symptoms, develop shortness of breath, life threatening emergency, suicidal or homicidal thoughts you must seek medical attention immediately by calling 911 or calling your MD immediately  if symptoms less severe. You must read complete instructions/literature along with all the possible adverse reactions/side effects for all the medicines you take and that have been prescribed to you. Take any new medicine only after you have completely understood and accepted all the possible adverse reactions/side effects.  Wear Seat belts while driving. You were cared for by a hospitalist during your hospital stay. If you have any questions about your discharge medications or the care you received while you were in the hospital after you are discharged, you can call the unit and ask to speak with the hospitalist or the covering physician. Once you are discharged, your primary care physician will handle any further medical issues. Please note that NO REFILLS for any discharge medications will be authorized once you are discharged, as it is imperative that you return to your primary care physician (or establish a relationship with a primary care physician if you do not have one).   Increase activity slowly   Complete by: As directed        Discharge Medications:   Allergies as of 03/20/2022   No Known Allergies      Medication List     STOP taking these medications    amoxicillin 500 MG capsule Commonly known as: AMOXIL       TAKE these medications    apixaban 5 MG Tabs tablet Commonly known as: ELIQUIS Take 1 tablet (5 mg total) by mouth 2 (two) times daily.   benzonatate 100 MG capsule Commonly known as: TESSALON Take 100 mg by mouth 3 (three) times daily as needed for cough.    promethazine-dextromethorphan 6.25-15 MG/5ML syrup Commonly known as: PROMETHAZINE-DM Take 5 mLs by mouth 4 (four) times daily as needed for cough.         The results of significant diagnostics from this hospitalization (including imaging, microbiology, ancillary and laboratory) are listed below for reference.    Procedures and Diagnostic Studies:   DG Chest Portable 1 View  Result Date: 03/18/2022 CLINICAL DATA:  Palpitations EXAM: PORTABLE CHEST 1 VIEW COMPARISON:  07/24/2017 FINDINGS: The heart size and mediastinal contours are within normal limits. Both lungs are clear. The visualized skeletal structures are unremarkable. IMPRESSION: No active disease. Electronically Signed  By: Rolm Baptise M.D.   On: 03/18/2022 20:05     Labs:   Basic Metabolic Panel: Recent Labs  Lab 03/18/22 1952 03/20/22 0155  NA 137 139  K 4.1 4.2  CL 103 109  CO2 21* 20*  GLUCOSE 124* 82  BUN 18 13  CREATININE 1.87* 1.04*  CALCIUM 9.6 8.1*   GFR Estimated Creatinine Clearance: 70 mL/min (A) (by C-G formula based on SCr of 1.04 mg/dL (H)). Liver Function Tests: Recent Labs  Lab 03/18/22 1952  AST 27  ALT 23  ALKPHOS 75  BILITOT 0.3  PROT 7.3  ALBUMIN 4.2   No results for input(s): "LIPASE", "AMYLASE" in the last 168 hours. No results for input(s): "AMMONIA" in the last 168 hours. Coagulation profile No results for input(s): "INR", "PROTIME" in the last 168 hours.  CBC: Recent Labs  Lab 03/18/22 1952 03/20/22 0155  WBC 4.6 3.4*  HGB 16.7* 13.2  HCT 50.1* 40.1  MCV 87.9 90.7  PLT 309 224   Cardiac Enzymes: No results for input(s): "CKTOTAL", "CKMB", "CKMBINDEX", "TROPONINI" in the last 168 hours. BNP: Invalid input(s): "POCBNP" CBG: No results for input(s): "GLUCAP" in the last 168 hours. D-Dimer No results for input(s): "DDIMER" in the last 72 hours. Hgb A1c No results for input(s): "HGBA1C" in the last 72 hours. Lipid Profile Recent Labs    03/20/22 0155   CHOL 117  HDL 36*  LDLCALC 62  TRIG 95  CHOLHDL 3.3   Thyroid function studies Recent Labs    03/20/22 0155  TSH 0.956   Anemia work up No results for input(s): "VITAMINB12", "FOLATE", "FERRITIN", "TIBC", "IRON", "RETICCTPCT" in the last 72 hours. Microbiology Recent Results (from the past 240 hour(s))  Resp panel by RT-PCR (RSV, Flu A&B, Covid) Anterior Nasal Swab     Status: Abnormal   Collection Time: 03/18/22  7:40 PM   Specimen: Anterior Nasal Swab  Result Value Ref Range Status   SARS Coronavirus 2 by RT PCR NEGATIVE NEGATIVE Final    Comment: (NOTE) SARS-CoV-2 target nucleic acids are NOT DETECTED.  The SARS-CoV-2 RNA is generally detectable in upper respiratory specimens during the acute phase of infection. The lowest concentration of SARS-CoV-2 viral copies this assay can detect is 138 copies/mL. A negative result does not preclude SARS-Cov-2 infection and should not be used as the sole basis for treatment or other patient management decisions. A negative result may occur with  improper specimen collection/handling, submission of specimen other than nasopharyngeal swab, presence of viral mutation(s) within the areas targeted by this assay, and inadequate number of viral copies(<138 copies/mL). A negative result must be combined with clinical observations, patient history, and epidemiological information. The expected result is Negative.  Fact Sheet for Patients:  EntrepreneurPulse.com.au  Fact Sheet for Healthcare Providers:  IncredibleEmployment.be  This test is no t yet approved or cleared by the [place] FDA and  has been authorized for detection and/or diagnosis of SARS-CoV-2 by FDA under an Emergency Use Authorization (EUA). This EUA will remain  in effect (meaning this test can be used) for the duration of the COVID-19 declaration under Section 564(b)(1) of the Act, 21 U.S.C.section 360bbb-3(b)(1), unless the  authorization is terminated  or revoked sooner.       Influenza A by PCR POSITIVE (A) NEGATIVE Final   Influenza B by PCR NEGATIVE NEGATIVE Final    Comment: (NOTE) The Xpert Xpress SARS-CoV-2/FLU/RSV plus assay is intended as an aid in the diagnosis of influenza from Nasopharyngeal swab  specimens and should not be used as a sole basis for treatment. Nasal washings and aspirates are unacceptable for Xpert Xpress SARS-CoV-2/FLU/RSV testing.  Fact Sheet for Patients: EntrepreneurPulse.com.au  Fact Sheet for Healthcare Providers: IncredibleEmployment.be  This test is not yet approved or cleared by the [place] FDA and has been authorized for detection and/or diagnosis of SARS-CoV-2 by FDA under an Emergency Use Authorization (EUA). This EUA will remain in effect (meaning this test can be used) for the duration of the COVID-19 declaration under Section 564(b)(1) of the Act, 21 U.S.C. section 360bbb-3(b)(1), unless the authorization is terminated or revoked.     Resp Syncytial Virus by PCR NEGATIVE NEGATIVE Final    Comment: (NOTE) Fact Sheet for Patients: EntrepreneurPulse.com.au  Fact Sheet for Healthcare Providers: IncredibleEmployment.be  This test is not yet approved or cleared by the [place] FDA and has been authorized for detection and/or diagnosis of SARS-CoV-2 by FDA under an Emergency Use Authorization (EUA). This EUA will remain in effect (meaning this test can be used) for the duration of the COVID-19 declaration under Section 564(b)(1) of the Act, 21 U.S.C. section 360bbb-3(b)(1), unless the authorization is terminated or revoked.  Performed at [facility], [address], [place], [place] [address]     Time coordinating discharge: 35 minutes  Signed: Terrilee Croak  [facility] 03/20/2022, 2:29 PM

## 2022-03-20 NOTE — Plan of Care (Signed)
  Problem: Clinical Measurements: ?Goal: Ability to maintain clinical measurements within normal limits will improve ?Outcome: Progressing ?Goal: Will remain free from infection ?Outcome: Progressing ?Goal: Diagnostic test results will improve ?Outcome: Progressing ?Goal: Respiratory complications will improve ?Outcome: Progressing ?Goal: Cardiovascular complication will be avoided ?Outcome: Progressing ?  ?

## 2022-03-20 NOTE — Progress Notes (Signed)
Echocardiogram 2D Echocardiogram has been performed.  Deanna Choi 03/20/2022, 1:08 PM

## 2022-04-13 ENCOUNTER — Inpatient Hospital Stay (HOSPITAL_COMMUNITY)
Admission: RE | Admit: 2022-04-13 | Discharge: 2022-04-13 | Disposition: A | Discharge status: Home / Self Care | Payer: 59 | Source: Ambulatory Visit | Attending: Nurse Practitioner | Admitting: Nurse Practitioner

## 2022-04-13 ENCOUNTER — Encounter (HOSPITAL_COMMUNITY): Payer: Self-pay | Admitting: *Deleted

## 2022-04-13 ENCOUNTER — Encounter (HOSPITAL_COMMUNITY): Payer: Self-pay | Admitting: Nurse Practitioner

## 2022-04-13 ENCOUNTER — Ambulatory Visit (HOSPITAL_COMMUNITY)
Admission: RE | Admit: 2022-04-13 | Discharge: 2022-04-13 | Disposition: A | Discharge status: Home / Self Care | Payer: 59 | Source: Ambulatory Visit | Attending: Nurse Practitioner | Admitting: Nurse Practitioner

## 2022-04-13 VITALS — BP 142/96 | HR 65 | Ht 66.0 in | Wt 227.0 lb

## 2022-04-13 DIAGNOSIS — I4891 Unspecified atrial fibrillation: Secondary | ICD-10-CM

## 2022-04-13 DIAGNOSIS — D6869 Other thrombophilia: Secondary | ICD-10-CM | POA: Diagnosis not present

## 2022-04-13 MED ORDER — DILTIAZEM HCL 30 MG PO TABS: ORAL_TABLET | ORAL | 1 refills | Status: AC

## 2022-04-13 NOTE — Progress Notes (Signed)
Primary Care Physician: Fredrich Romans, Vazquez Referring Physician: [facility] f/u    Deanna Choi is a 62 y.o. female with a h/o  obesity, diverticulosis, gastric ulcers , patient presented to the ED at [facility] with complaint of palpitation and a near syncopal episode.  Started onset of symptoms about 4 hours prior to presentation.she was found to be in new afib with RVR with HR's in the 180's. BP soft at 90/60 which improved with hydration. She was started on cardizem drip and did later self convert. She was also found to be positive for influenza A. She had to wait 18 hours in the ED before bed was available to admit pt. She was started on heparin in the ED but transitioned to eliquis prior to d/c with a CHA2DS2VASc of 1 (htn listed in hosptial note, but pt denies and is not on any meds for same).  Echo with normal EF. D/c in SR.   In the clinic today, she remains in [place]. She has recovered from  the flu. Just a few palpitations felt since d/c. She does snore with witnessed apnea and will order a sleep study. No alcohol or tobacco use. No regular exercise program.   Today, she denies symptoms of palpitations, chest pain, shortness of breath, orthopnea, PND, lower extremity edema, dizziness, presyncope, syncope, or neurologic sequela. The patient is tolerating medications without difficulties and is otherwise without complaint today.   Past Medical History:  Diagnosis Date   Allergy    Diverticulosis    gastric ulcers    Glaucoma    Headache    Hypertension    Hypomagnesemia    Obesity    Skin cancer    Past Surgical History:  Procedure Laterality Date   KNEE SURGERY     SALIVARY GLAND SURGERY     SQUAMOUS CELL CARCINOMA EXCISION     UPPER GASTROINTESTINAL ENDOSCOPY  06/2021   VAGINAL HYSTERECTOMY  2009    Current Outpatient Medications  Medication Sig Dispense Refill   apixaban (ELIQUIS) 5 MG TABS tablet Take 1 tablet (5 mg total) by mouth 2 (two) times daily. 60 tablet 2    benzonatate (TESSALON) 100 MG capsule Take 100 mg by mouth 3 (three) times daily as needed for cough.     promethazine-dextromethorphan (PROMETHAZINE-DM) 6.25-15 MG/5ML syrup Take 5 mLs by mouth 4 (four) times daily as needed for cough.     No current facility-administered medications for this encounter.    No Known Allergies  Social History   Socioeconomic History   Marital status: Married    Spouse name: Not on file   Number of children: 2   Years of education: Not on file   Highest education level: Not on file  Occupational History   Occupation: Geophysical data processor - [employer]  Tobacco Use   Smoking status: Never   Smokeless tobacco: Never  Vaping Use   Vaping Use: Never used  Substance and Sexual Activity   Alcohol use: Not Currently    Comment: rare   Drug use: No   Sexual activity: Not on file  Other Topics Concern   Not on file  Social History Narrative   Not on file   Social Determinants of Health   Financial Resource Strain: Not on file  Food Insecurity: No Food Insecurity (03/20/2022)   Hunger Vital Sign    Worried About Running Out of Food in the Last Year: Never true    Ran Out of Food in the Last Year: Never true  Transportation Needs: No Transportation Needs (03/20/2022)   PRAPARE - Hydrologist (Medical): No    Lack of Transportation (Non-Medical): No  Physical Activity: Not on file  Stress: Not on file  Social Connections: Not on file  Intimate Partner Violence: Not At Risk (03/20/2022)   Humiliation, Afraid, Rape, and Kick questionnaire    Fear of Current or Ex-Partner: No    Emotionally Abused: No    Physically Abused: No    Sexually Abused: No    Family History  Problem Relation Age of Onset   Hypertension Mother    Colon polyps Mother    Hypertension Father    Stroke Father    Irritable bowel syndrome Father    Heart disease Brother        CABG- secondary to heart disease   Hypertension Other    Colon cancer Neg Hx     Esophageal cancer Neg Hx    Stomach cancer Neg Hx    Pancreatic cancer Neg Hx    Liver cancer Neg Hx    Rectal cancer Neg Hx     ROS- All systems are reviewed and negative except as per the HPI above  Physical Exam: There were no vitals filed for this visit. Wt Readings from Last 3 Encounters:  03/19/22 101.4 kg  08/29/21 100.7 kg  06/22/21 100.7 kg    Labs: Lab Results  Component Value Date   NA 139 03/20/2022   K 4.2 03/20/2022   CL 109 03/20/2022   CO2 20 (L) 03/20/2022   GLUCOSE 82 03/20/2022   BUN 13 03/20/2022   CREATININE 1.04 (H) 03/20/2022   CALCIUM 8.1 (L) 03/20/2022   No results found for: "INR" Lab Results  Component Value Date   CHOL 117 03/20/2022   HDL 36 (L) 03/20/2022   LDLCALC 62 03/20/2022   TRIG 95 03/20/2022     GEN- The patient is well appearing, alert and oriented x 3 today.   Head- normocephalic, atraumatic Eyes-  Sclera clear, conjunctiva pink Ears- hearing intact Oropharynx- clear Neck- supple, no JVP Lymph- no cervical lymphadenopathy Lungs- Clear to ausculation bilaterally, normal work of breathing Heart- Regular rate and rhythm, no murmurs, rubs or gallops, PMI not laterally displaced GI- soft, NT, ND, + BS Extremities- no clubbing, cyanosis, or edema MS- no significant deformity or atrophy Skin- no rash or lesion Psych- euthymic mood, full affect Neuro- strength and sensation are intact  EKG-Vent. rate 65 BPM PR interval 142 ms QRS duration 68 ms QT/QTcB 404/420 ms P-R-T axes 65 10 40 Normal sinus rhythm Low voltage QRS Cannot rule out Anterior infarct , age undetermined Abnormal ECG When compared with ECG of 18-Mar-2022 20:04, PREVIOUS ECG IS PRESENT  Echo-   1. Left ventricular ejection fraction, by estimation, is 60 to 65%. The  left ventricle has normal function. The left ventricle has no regional  wall motion abnormalities. Left ventricular diastolic parameters were  normal.   2. Right ventricular systolic  function is normal. The right ventricular  size is normal.   3. Left atrial size was mildly dilated.   4. The mitral valve is abnormal. Trivial mitral valve regurgitation. No  evidence of mitral stenosis.   5. Small gradient across AV but no stenossi AVA 2.6 cm2 . The aortic  valve was not well visualized. Aortic valve regurgitation is trivial. No  aortic stenosis is present.   6. The inferior vena cava is normal in size with greater than 50%  respiratory variability, suggesting right atrial pressure of 3 mmHg.    Assessment and Plan:  1. New onset afib In the setting of Influenza A Converted in the ED and has not felt any sustained palpitations since  General education re afib and triggers  Discussed apple watch and Kardia mobile devices to track afib going forward Will RX cardizem 30 mg daily for sustained HR's in afib over 578 and systolic of at least 978 Echo reviewed Sleep study ordered Regular  exercise program and diet modification encouraged   2. CHA2DS2VASc  score of 1 (female)  Pt  will stay on eliquis 5 mg bid  for now but with low stroke risk do not think she will need long term I will place a 2 week zio patch to see afib burden and then further decisions  can be made   F/u in 3-4 weeks   Butch Penny C. Saniyyah Elster, [facility] [address] [place], [place] [address] [phone]

## 2022-04-13 NOTE — Patient Instructions (Signed)
Cardizem 30mg  -- Take 1 tablet every 4 hours AS NEEDED for AFIB heart rate >100 as long as top BP >100.

## 2022-05-09 ENCOUNTER — Ambulatory Visit (HOSPITAL_COMMUNITY)
Admission: RE | Admit: 2022-05-09 | Discharge: 2022-05-09 | Disposition: A | Discharge status: Home / Self Care | Payer: 59 | Source: Ambulatory Visit | Attending: Physician Assistant | Admitting: Physician Assistant

## 2022-05-09 VITALS — BP 158/98 | HR 62 | Ht 66.0 in | Wt 227.4 lb

## 2022-05-09 DIAGNOSIS — R0683 Snoring: Secondary | ICD-10-CM | POA: Insufficient documentation

## 2022-05-09 DIAGNOSIS — E669 Obesity, unspecified: Secondary | ICD-10-CM | POA: Insufficient documentation

## 2022-05-09 DIAGNOSIS — I10 Essential (primary) hypertension: Secondary | ICD-10-CM | POA: Diagnosis not present

## 2022-05-09 DIAGNOSIS — I480 Paroxysmal atrial fibrillation: Secondary | ICD-10-CM | POA: Insufficient documentation

## 2022-05-09 NOTE — Progress Notes (Signed)
Primary Care Physician: Fredrich Romans, Kiln Referring Physician: [facility] f/u    Deanna Choi is a 62 y.o. female with a h/o  obesity, HTN, diverticulosis, gastric ulcers , patient presented to the ED at [facility] with complaint of palpitation and a near syncopal episode.  Started onset of symptoms about 4 hours prior to presentation.she was found to be in new afib with RVR with HR's in the 180's. BP soft at 90/60 which improved with hydration. She was started on cardizem drip and did later self convert. She was also found to be positive for influenza A. She had to wait 18 hours in the ED before bed was available to admit pt. She was started on heparin in the ED but transitioned to eliquis prior to d/c with a CHA2DS2VASc of 2 (htn listed in hosptial note, but pt denies and is not on any meds for same).  Echo with normal EF. D/c in SR. She does snore with witnessed apnea. No alcohol or tobacco use.   On follow up today, patient reports that she has done well since her last visit. She has not had any further tachypalpitations or presyncope. No bleeding issues on anticoagulation. She completed her Zio monitor, results pending.   Today, she denies symptoms of palpitations, chest pain, shortness of breath, orthopnea, PND, lower extremity edema, dizziness, presyncope, syncope, or neurologic sequela. The patient is tolerating medications without difficulties and is otherwise without complaint today.   Past Medical History:  Diagnosis Date   Allergy    Diverticulosis    gastric ulcers    Glaucoma    Headache    Hypertension    Hypomagnesemia    Obesity    Skin cancer    Past Surgical History:  Procedure Laterality Date   KNEE SURGERY     SALIVARY GLAND SURGERY     SQUAMOUS CELL CARCINOMA EXCISION     UPPER GASTROINTESTINAL ENDOSCOPY  06/2021   VAGINAL HYSTERECTOMY  2009    Current Outpatient Medications  Medication Sig Dispense Refill   diltiazem (CARDIZEM) 30 MG tablet Take 1 tablet  every 4 hours AS NEEDED for AFIB heart rate >100 as long as top BP >100. 30 tablet 1   loratadine (CLARITIN) 10 MG tablet Take 10 mg by mouth as needed.     omeprazole (PRILOSEC) 40 MG capsule Take 40 mg by mouth as needed.     No current facility-administered medications for this encounter.    No Known Allergies  Social History   Socioeconomic History   Marital status: Married    Spouse name: Not on file   Number of children: 2   Years of education: Not on file   Highest education level: Not on file  Occupational History   Occupation: Geophysical data processor - [employer]  Tobacco Use   Smoking status: Never   Smokeless tobacco: Never  Vaping Use   Vaping Use: Never used  Substance and Sexual Activity   Alcohol use: Not Currently    Comment: rare   Drug use: No   Sexual activity: Not on file  Other Topics Concern   Not on file  Social History Narrative   Not on file   Social Determinants of Health   Financial Resource Strain: Not on file  Food Insecurity: No Food Insecurity (03/20/2022)   Hunger Vital Sign    Worried About Running Out of Food in the Last Year: Never true    Ran Out of Food in the Last Year: Never true  Transportation Needs: No Transportation Needs (03/20/2022)   PRAPARE - Hydrologist (Medical): No    Lack of Transportation (Non-Medical): No  Physical Activity: Not on file  Stress: Not on file  Social Connections: Not on file  Intimate Partner Violence: Not At Risk (03/20/2022)   Humiliation, Afraid, Rape, and Kick questionnaire    Fear of Current or Ex-Partner: No    Emotionally Abused: No    Physically Abused: No    Sexually Abused: No    Family History  Problem Relation Age of Onset   Hypertension Mother    Colon polyps Mother    Hypertension Father    Stroke Father    Irritable bowel syndrome Father    Heart disease Brother        CABG- secondary to heart disease   Hypertension Other    Colon cancer Neg Hx     Esophageal cancer Neg Hx    Stomach cancer Neg Hx    Pancreatic cancer Neg Hx    Liver cancer Neg Hx    Rectal cancer Neg Hx     ROS- All systems are reviewed and negative except as per the HPI above  Physical Exam: Vitals:   05/09/22 0857  BP: (!) 158/98  Pulse: 62  Weight: 103.1 kg  Height: '5\' 6"'$  (1.676 m)   Wt Readings from Last 3 Encounters:  05/09/22 103.1 kg  04/13/22 103 kg  03/19/22 101.4 kg    Labs: Lab Results  Component Value Date   NA 139 03/20/2022   K 4.2 03/20/2022   CL 109 03/20/2022   CO2 20 (L) 03/20/2022   GLUCOSE 82 03/20/2022   BUN 13 03/20/2022   CREATININE 1.04 (H) 03/20/2022   CALCIUM 8.1 (L) 03/20/2022   No results found for: "INR" Lab Results  Component Value Date   CHOL 117 03/20/2022   HDL 36 (L) 03/20/2022   LDLCALC 62 03/20/2022   TRIG 95 03/20/2022     GEN- The patient is a well appearing obese female, alert and oriented x 3 today.   HEENT-head normocephalic, atraumatic, sclera clear, conjunctiva pink, hearing intact, trachea midline. Lungs- Clear to ausculation bilaterally, normal work of breathing Heart- Regular rate and rhythm, no murmurs, rubs or gallops  GI- soft, NT, ND, + BS Extremities- no clubbing, cyanosis, or edema MS- no significant deformity or atrophy Skin- no rash or lesion Psych- euthymic mood, full affect Neuro- strength and sensation are intact   EKG today demonstrates SR Vent. rate 62 BPM PR interval 142 ms QRS duration 70 ms QT/QTcB 394/399 ms  Echo 03/20/22 1. Left ventricular ejection fraction, by estimation, is 60 to 65%. The left ventricle has normal function. The left ventricle has no regional wall motion abnormalities. Left ventricular diastolic parameters were normal.   2. Right ventricular systolic function is normal. The right ventricular  size is normal.   3. Left atrial size was mildly dilated.   4. The mitral valve is abnormal. Trivial mitral valve regurgitation. No  evidence of mitral  stenosis.   5. Small gradient across AV but no stenossi AVA 2.6 cm2 . The aortic  valve was not well visualized. Aortic valve regurgitation is trivial. No  aortic stenosis is present.   6. The inferior vena cava is normal in size with greater than 50%  respiratory variability, suggesting right atrial pressure of 3 mmHg.    CHA2DS2-VASc Score = 2  The patient's score is based upon: CHF History: 0  HTN History: 1 Diabetes History: 0 Stroke History: 0 Vascular Disease History: 0 Age Score: 0 Gender Score: 1       ASSESSMENT AND PLAN: 1. Paroxysmal Atrial Fibrillation (ICD10:  I48.0) The patient's CHA2DS2-VASc score is 2, indicating a 2.2% annual risk of stroke.   Diagnosed in setting of Flu A infection.  Patient appears to be maintaining SR.  Monitor results pending.  After discussing the risks and benefits of anticoagulation and the current guidelines, will discontinue Eliquis with low CV score. Continue diltiazem 30 mg PRN q 4 hours for heart racing.  We briefly discussed AAD or ablation if she should need rhythm control in the future.   2. Snoring/witnessed apnea Referred for sleep study.  3. HTN Elevated today, patient reports it is frequently better at home. She will keep a BP log to review with her PCP in April.     Follow up in the AF clinic in 6 months.    [facility] [address] [place], [place] [address] [phone]

## 2022-05-09 NOTE — Patient Instructions (Signed)
Stop Eliquis

## 2022-05-09 NOTE — Addendum Note (Signed)
Encounter addended by: Juluis Mire, RN on: 05/09/2022 10:37 AM  Actions taken: Imaging Exam ended

## 2022-05-12 ENCOUNTER — Encounter (HOSPITAL_COMMUNITY): Payer: Self-pay | Admitting: *Deleted

## 2022-05-17 ENCOUNTER — Telehealth (HOSPITAL_COMMUNITY): Payer: Self-pay

## 2022-05-17 NOTE — Telephone Encounter (Signed)
Patient was given samples of Eliquis 5 mg on 2/8.  [employer] Quantity: 42 tablets Lot # [id number] Exp: 12/04/2023

## 2022-05-24 ENCOUNTER — Telehealth: Payer: Self-pay | Admitting: *Deleted

## 2022-05-24 DIAGNOSIS — R002 Palpitations: Secondary | ICD-10-CM

## 2022-05-24 DIAGNOSIS — R519 Headache, unspecified: Secondary | ICD-10-CM

## 2022-05-24 DIAGNOSIS — I480 Paroxysmal atrial fibrillation: Secondary | ICD-10-CM

## 2022-05-24 NOTE — Telephone Encounter (Signed)
Prior Authorization for split night sent to [facility] via web portal. Tracking Number .  3/20  Not Covered/Not Approved 3/14  notification/prior authorization reference number is [id number].  3/13   PENDED-Reason:1.Disposition pending review-Tracking [id number] 3/13  Decision ID #: [id number]

## 2022-06-07 NOTE — Addendum Note (Signed)
Addended by: Juluis Mire on: 06/07/2022 11:16 AM   Modules accepted: Orders

## 2022-06-19 ENCOUNTER — Ambulatory Visit (HOSPITAL_BASED_OUTPATIENT_CLINIC_OR_DEPARTMENT_OTHER): Payer: 59 | Attending: Physician Assistant | Admitting: Cardiology

## 2022-06-19 VITALS — Ht 66.0 in | Wt 216.0 lb

## 2022-06-19 DIAGNOSIS — G4733 Obstructive sleep apnea (adult) (pediatric): Secondary | ICD-10-CM | POA: Insufficient documentation

## 2022-06-19 DIAGNOSIS — I480 Paroxysmal atrial fibrillation: Secondary | ICD-10-CM | POA: Diagnosis not present

## 2022-06-25 NOTE — Procedures (Signed)
   Patient Name: Deanna Choi, Deanna Choi Date: 06/19/2022 Gender: Female D.O.B: [birth year] Age (years): 74 Referring Provider: Alphonzo Severance PA Height (inches): 66 Interpreting Physician: Armanda Magic MD, ABSM Weight (lbs): 216 RPSGT: Angie Sink BMI: 35 MRN: [id number] Neck Size: 14.50  CLINICAL INFORMATION Sleep Study Type: HST  Indication for sleep study: N/A  Epworth Sleepiness Score: 4  SLEEP STUDY TECHNIQUE A multi-channel overnight portable sleep study was performed. The channels recorded were: nasal airflow, thoracic respiratory movement, and oxygen saturation with a pulse oximetry. Snoring was also monitored.  MEDICATIONS Patient self administered medications include: N/A.  SLEEP ARCHITECTURE Patient was studied for 375.5 minutes. The sleep efficiency was 100.0 % and the patient was supine for 0%. The arousal index was 0.0 per hour.  RESPIRATORY PARAMETERS The overall AHI was 40.1 per hour, with a central apnea index of 0 per hour.  The oxygen nadir was 86% during sleep.  CARDIAC DATA Mean heart rate during sleep was 57.1 bpm.  IMPRESSIONS - Severe obstructive sleep apnea occurred during this study (AHI = 40.1/h). - Moderate oxygen desaturation was noted during this study (Min O2 = 86%). - Patient snored 30.7% during the sleep.  DIAGNOSIS - Obstructive Sleep Apnea (G47.33)  RECOMMENDATIONS - Recommend in lab CPAP titration.  - Positional therapy avoiding supine position during sleep. - Avoid alcohol, sedatives and other CNS depressants that may worsen sleep apnea and disrupt normal sleep architecture. - Sleep hygiene should be reviewed to assess factors that may improve sleep quality. - Weight management and regular exercise should be initiated or continued.  [Electronically signed] 06/25/2022 06:59 PM  Armanda Magic MD, ABSM Diplomate, American Board of Sleep Medicine

## 2022-08-08 NOTE — Telephone Encounter (Signed)
The patient has been notified of the result and verbalized understanding.  All questions (if any) were answered. Deanna Choi, CMA 08/08/2022 6:18 PM    Patient will think about the titration and call us back.

## 2022-11-14 ENCOUNTER — Ambulatory Visit (HOSPITAL_COMMUNITY)
Admission: RE | Admit: 2022-11-14 | Discharge: 2022-11-14 | Disposition: A | Discharge status: Home / Self Care | Payer: BLUE CROSS/BLUE SHIELD | Source: Ambulatory Visit | Attending: Physician Assistant | Admitting: Physician Assistant

## 2022-11-14 VITALS — BP 146/100 | HR 54 | Ht 66.0 in | Wt 187.4 lb

## 2022-11-14 DIAGNOSIS — Z79899 Other long term (current) drug therapy: Secondary | ICD-10-CM | POA: Insufficient documentation

## 2022-11-14 DIAGNOSIS — G4733 Obstructive sleep apnea (adult) (pediatric): Secondary | ICD-10-CM | POA: Diagnosis not present

## 2022-11-14 DIAGNOSIS — I480 Paroxysmal atrial fibrillation: Secondary | ICD-10-CM | POA: Diagnosis present

## 2022-11-14 DIAGNOSIS — I10 Essential (primary) hypertension: Secondary | ICD-10-CM | POA: Diagnosis not present

## 2022-11-14 NOTE — Progress Notes (Signed)
Primary Care Physician: Burnice Logan, PA Referring Physician: [facility] f/u    Deanna Choi is a 62 y.o. female with a h/o  OSA, HTN, diverticulosis, gastric ulcers , patient presented to the ED at [facility] with complaint of palpitation and a near syncopal episode.  Started onset of symptoms about 4 hours prior to presentation.she was found to be in new afib with RVR with HR's in the 180's. BP soft at 90/60 which improved with hydration. She was started on cardizem drip and did later self convert. She was also found to be positive for influenza A. She had to wait 18 hours in the ED before bed was available to admit pt. She was started on heparin in the ED but transitioned to eliquis prior to d/c with a CHA2DS2VASc of 2 (htn listed in hosptial note, but pt denies and is not on any meds for same).  Echo with normal EF. D/c in SR. She does snore with witnessed apnea. No alcohol or tobacco use. Zio monitor 05/2022 showed no afib.   On follow up today, patient reports that she has done well since her last visit. She has not had any interim symptoms of afib. She did have a sleep study which showed severe OSA. She has not yet completed her CPAP titration due to changing insurances.   Today, she denies symptoms of palpitations, chest pain, shortness of breath, orthopnea, PND, lower extremity edema, dizziness, presyncope, syncope, or neurologic sequela. The patient is tolerating medications without difficulties and is otherwise without complaint today.   Past Medical History:  Diagnosis Date   Allergy    Diverticulosis    gastric ulcers    Glaucoma    Headache    Hypertension    Hypomagnesemia    Obesity    Skin cancer     Current Outpatient Medications  Medication Sig Dispense Refill   diltiazem (CARDIZEM) 30 MG tablet Take 1 tablet every 4 hours AS NEEDED for AFIB heart rate >100 as long as top BP >100. 30 tablet 1   loratadine (CLARITIN) 10 MG tablet Take 10 mg by mouth as needed.      omeprazole (PRILOSEC) 40 MG capsule Take 40 mg by mouth as needed.     No current facility-administered medications for this encounter.    ROS- All systems are reviewed and negative except as per the HPI above  Physical Exam: Vitals:   11/14/22 0946  BP: (!) 146/100  Pulse: (!) 54  Weight: 85 kg  Height: 5\' 6"  (1.676 m)    Wt Readings from Last 3 Encounters:  11/14/22 85 kg  06/19/22 98 kg  05/09/22 103.1 kg    GEN: Well nourished, well developed in no acute distress NECK: No JVD; No carotid bruits CARDIAC: Regular rate and rhythm, no murmurs, rubs, gallops RESPIRATORY:  Clear to auscultation without rales, wheezing or rhonchi  ABDOMEN: Soft, non-tender, non-distended EXTREMITIES:  No edema; No deformity    EKG today demonstrates SB Vent. rate 54 BPM PR interval 132 ms QRS duration 70 ms QT/QTcB 440/417 ms  Echo 03/20/22 1. Left ventricular ejection fraction, by estimation, is 60 to 65%. The left ventricle has normal function. The left ventricle has no regional wall motion abnormalities. Left ventricular diastolic parameters were normal.   2. Right ventricular systolic function is normal. The right ventricular  size is normal.   3. Left atrial size was mildly dilated.   4. The mitral valve is abnormal. Trivial mitral valve regurgitation. No  evidence  of mitral stenosis.   5. Small gradient across AV but no stenossi AVA 2.6 cm2 . The aortic  valve was not well visualized. Aortic valve regurgitation is trivial. No  aortic stenosis is present.   6. The inferior vena cava is normal in size with greater than 50%  respiratory variability, suggesting right atrial pressure of 3 mmHg.    CHA2DS2-VASc Score = 2  The patient's score is based upon: CHF History: 0 HTN History: 1 Diabetes History: 0 Stroke History: 0 Vascular Disease History: 0 Age Score: 0 Gender Score: 1       ASSESSMENT AND PLAN: Paroxysmal Atrial Fibrillation (ICD10:  I48.0) The patient's  CHA2DS2-VASc score is 2, indicating a 2.2% annual risk of stroke.   Diagnosed in setting of Flu A infection.  Zio monitor 05/2022 showed no afib or sustained arrhythmias Currently off anticoagulation with low CV score.  Continue diltiazem 30 mg PRN q 4 hours for heart racing.  Can consider AAD or ablation if she should need rhythm control in the future.   OSA  Severe on sleep study 06/19/22 The importance of adequate treatment of sleep apnea was discussed today in order to improve our ability to maintain sinus rhythm long term. Pending CPAP titration. She has the phone number and plans to call sleep center once insurance is in place.   HTN Elevated today, 130s/80s at home. Suspect untreated OSA contributing.    Follow up in the AF clinic as needed.    Jorja Loa PA-C Afib [facility] [address] [place], [place] [address] [phone]

## 2022-12-12 ENCOUNTER — Other Ambulatory Visit: Payer: Self-pay

## 2022-12-12 ENCOUNTER — Encounter (HOSPITAL_BASED_OUTPATIENT_CLINIC_OR_DEPARTMENT_OTHER): Payer: Self-pay | Admitting: Emergency Medicine

## 2022-12-12 ENCOUNTER — Other Ambulatory Visit (HOSPITAL_BASED_OUTPATIENT_CLINIC_OR_DEPARTMENT_OTHER): Payer: Self-pay

## 2022-12-12 ENCOUNTER — Emergency Department (HOSPITAL_BASED_OUTPATIENT_CLINIC_OR_DEPARTMENT_OTHER)
Admission: EM | Admit: 2022-12-12 | Discharge: 2022-12-12 | Disposition: A | Discharge status: Home / Self Care | Payer: Federal, State, Local not specified - PPO | Attending: Emergency Medicine | Admitting: Emergency Medicine

## 2022-12-12 ENCOUNTER — Emergency Department (HOSPITAL_BASED_OUTPATIENT_CLINIC_OR_DEPARTMENT_OTHER): Payer: Federal, State, Local not specified - PPO | Admitting: Radiology

## 2022-12-12 ENCOUNTER — Emergency Department (HOSPITAL_BASED_OUTPATIENT_CLINIC_OR_DEPARTMENT_OTHER): Payer: Federal, State, Local not specified - PPO

## 2022-12-12 DIAGNOSIS — S0990XA Unspecified injury of head, initial encounter: Secondary | ICD-10-CM | POA: Diagnosis not present

## 2022-12-12 DIAGNOSIS — M79651 Pain in right thigh: Secondary | ICD-10-CM | POA: Diagnosis not present

## 2022-12-12 DIAGNOSIS — R0789 Other chest pain: Secondary | ICD-10-CM | POA: Diagnosis not present

## 2022-12-12 DIAGNOSIS — W19XXXA Unspecified fall, initial encounter: Secondary | ICD-10-CM

## 2022-12-12 DIAGNOSIS — S022XXA Fracture of nasal bones, initial encounter for closed fracture: Secondary | ICD-10-CM | POA: Diagnosis not present

## 2022-12-12 DIAGNOSIS — W010XXA Fall on same level from slipping, tripping and stumbling without subsequent striking against object, initial encounter: Secondary | ICD-10-CM | POA: Diagnosis not present

## 2022-12-12 DIAGNOSIS — M25531 Pain in right wrist: Secondary | ICD-10-CM | POA: Diagnosis not present

## 2022-12-12 DIAGNOSIS — S0992XA Unspecified injury of nose, initial encounter: Secondary | ICD-10-CM | POA: Diagnosis present

## 2022-12-12 MED ORDER — ONDANSETRON HCL 4 MG/2ML IJ SOLN
4.0000 mg | Freq: Once | INTRAMUSCULAR | Status: AC
  Administered 2022-12-12: 4 mg via INTRAVENOUS
  Filled 2022-12-12: qty 2

## 2022-12-12 MED ORDER — OXYCODONE HCL 5 MG PO TABS
5.0000 mg | ORAL_TABLET | Freq: Four times a day (QID) | ORAL | 0 refills | Status: AC | PRN
Start: 2022-12-12 — End: ?
  Filled 2022-12-12: qty 15, 4d supply, fill #0

## 2022-12-12 MED ORDER — OXYCODONE HCL 5 MG PO TABS
5.0000 mg | ORAL_TABLET | Freq: Once | ORAL | Status: AC
  Administered 2022-12-12: 5 mg via ORAL
  Filled 2022-12-12: qty 1

## 2022-12-12 MED ORDER — ONDANSETRON HCL 4 MG PO TABS: 4.0000 mg | ORAL_TABLET | Freq: Four times a day (QID) | ORAL | 0 refills | Status: AC

## 2022-12-12 MED ORDER — MORPHINE SULFATE (PF) 4 MG/ML IV SOLN
4.0000 mg | Freq: Once | INTRAVENOUS | Status: AC
  Administered 2022-12-12: 4 mg via INTRAVENOUS
  Filled 2022-12-12: qty 1

## 2022-12-12 MED ORDER — LACTATED RINGERS IV BOLUS
1000.0000 mL | Freq: Once | INTRAVENOUS | Status: AC
  Administered 2022-12-12: 1000 mL via INTRAVENOUS

## 2022-12-12 MED ORDER — KETOROLAC TROMETHAMINE 15 MG/ML IJ SOLN
15.0000 mg | Freq: Once | INTRAMUSCULAR | Status: AC
  Administered 2022-12-12: 15 mg via INTRAVENOUS
  Filled 2022-12-12: qty 1

## 2022-12-12 MED ORDER — CELECOXIB 200 MG PO CAPS
200.0000 mg | ORAL_CAPSULE | Freq: Two times a day (BID) | ORAL | 0 refills | Status: AC
  Filled 2022-12-12: qty 20, 10d supply, fill #0

## 2022-12-12 MED ORDER — ACETAMINOPHEN 500 MG PO TABS
1000.0000 mg | ORAL_TABLET | Freq: Once | ORAL | Status: AC
  Administered 2022-12-12: 1000 mg via ORAL
  Filled 2022-12-12: qty 2

## 2022-12-12 NOTE — ED Notes (Signed)
To CT via stretcher

## 2022-12-12 NOTE — ED Provider Notes (Signed)
[place] EMERGENCY DEPARTMENT AT [facility] Provider Note   CSN: [id number] Arrival date & time: 12/12/22  1256     History Chief Complaint  Patient presents with   Fall    HPI Deanna Choi is a 62 y.o. female presenting for chief complaint of ground-level fall.  62 year old female minimal medical history.  States that she was pulled over by her dog.  Denies fevers chills nausea vomiting syncope shortness of breath.  Hit her head on the asphalt, has right wrist pain, right thoracic rib cage pain, relatively severe headache.  No loss of consciousness.  Already cleaned up by nursing friend who is at bedside.  Otherwise ambulatory tolerating p.o. intake..   Patient's recorded medical, surgical, social, medication list and allergies were reviewed in the Snapshot window as part of the initial history.   Review of Systems   Review of Systems  Constitutional:  Negative for chills and fever.  HENT:  Negative for ear pain and sore throat.   Eyes:  Negative for pain and visual disturbance.  Respiratory:  Negative for cough and shortness of breath.   Cardiovascular:  Negative for chest pain and palpitations.  Gastrointestinal:  Negative for abdominal pain and vomiting.  Genitourinary:  Negative for dysuria and hematuria.  Musculoskeletal:  Negative for arthralgias and back pain.  Skin:  Negative for color change and rash.  Neurological:  Positive for headaches. Negative for seizures and syncope.  All other systems reviewed and are negative.   Physical Exam Updated Vital Signs BP 135/79 (BP Location: Left Wrist)   Pulse (!) 57   Temp 97.9 F (36.6 C) (Oral)   Resp 18   Wt 83.9 kg   SpO2 95%   BMI 29.86 kg/m  Physical Exam Constitutional:      General: She is not in acute distress.    Appearance: She is not ill-appearing or toxic-appearing.  HENT:     Head: Normocephalic and atraumatic.  Eyes:     Extraocular Movements: Extraocular movements intact.      Pupils: Pupils are equal, round, and reactive to light.  Cardiovascular:     Rate and Rhythm: Normal rate.  Pulmonary:     Effort: No respiratory distress.  Abdominal:     General: Abdomen is flat.  Musculoskeletal:        General: Tenderness (TTP on the right midface, right thoracic cavity, right wrist, right hip.) present. No swelling, deformity or signs of injury.     Cervical back: Normal range of motion. No rigidity.  Skin:    General: Skin is warm and dry.  Neurological:     General: No focal deficit present.     Mental Status: She is alert and oriented to person, place, and time.  Psychiatric:        Mood and Affect: Mood normal.      ED Course/ Medical Decision Making/ A&P Clinical Course as of 12/12/22 1856  Tue Dec 12, 2022  1723 2. Displaced fracture through the orbital floor on the right. The inferior rectus muscle body is not appear displaced. 3. Mildly displaced fracture through the anterior wall of the right maxillary sinus 4. Displaced nasal bone fracture on the right.   [CC]    Clinical Course User Index [CC] Glyn Ade, MD    Procedures Procedures   Medications Ordered in ED Medications  morphine (PF) 4 MG/ML injection 4 mg (4 mg Intravenous Given 12/12/22 1530)  ondansetron (ZOFRAN) injection 4 mg (4 mg Intravenous Given  12/12/22 1529)  acetaminophen (TYLENOL) tablet 1,000 mg (1,000 mg Oral Given 12/12/22 1706)  lactated ringers bolus 1,000 mL (0 mLs Intravenous Stopped 12/12/22 1816)  ondansetron (ZOFRAN) injection 4 mg (4 mg Intravenous Given 12/12/22 1615)  ketorolac (TORADOL) 15 MG/ML injection 15 mg (15 mg Intravenous Given 12/12/22 1815)  oxyCODONE (Oxy IR/ROXICODONE) immediate release tablet 5 mg (5 mg Oral Given 12/12/22 1815)   Medical Decision Making:    Deanna Choi is a 62 y.o. female who presented to the ED today with a moderate mechanisma trauma, detailed above.    Additional history discussed with patient's  family/caregivers.  Patient placed on continuous vitals and telemetry monitoring while in ED which was reviewed periodically.   Given this mechanism of trauma, a full physical exam was performed. Notably, patient was HDS in NAD.   Reviewed and confirmed nursing documentation for past medical history, family history, social history.    Initial Assessment/Plan:   This is a patient presenting with a moderate mechanism trauma.  As such, I have considered intracranial injuries including intracranial hemorrhage, intrathoracic injuries including blunt myocardial or blunt lung injury, blunt abdominal injuries including aortic dissection, bladder injury, spleen injury, liver injury and I have considered orthopedic injuries including extremity or spinal injury.  With the patient's presentation of moderate mechanism trauma and abnormalities detailed above, patient warrants aggressive evaluation for potential traumatic injuries. Will proceed with CT Head, Cervical Spine, and Chest. Scans resulted with multiple facial bone fractures without any signs of orbital entrapment or abnormality.. Final Reassessment and Plan:   No emergent pathology detected.  Patient given precautions regarding nose blowing, symptom control and referred to otolaryngology for definitive management of these fractures.  Clinical exam without evidence of entrapment (visual acuity intact, no orbital ecchymosis, extraocular motions intact) Will refer to ENT for management of this condition .   Disposition:  I have considered need for hospitalization, however, considering all of the above, I believe this patient is stable for discharge at this time.  Patient/family educated about specific return precautions for given chief complaint and symptoms.  Patient/family educated about follow-up with PCP.     Patient/family expressed understanding of return precautions and need for follow-up. Patient spoken to regarding all imaging and laboratory  results and appropriate follow up for these results. All education provided in verbal form with additional information in written form. Time was allowed for answering of patient questions. Patient discharged.    Emergency Department Medication Summary:   Medications  morphine (PF) 4 MG/ML injection 4 mg (4 mg Intravenous Given 12/12/22 1530)  ondansetron (ZOFRAN) injection 4 mg (4 mg Intravenous Given 12/12/22 1529)  acetaminophen (TYLENOL) tablet 1,000 mg (1,000 mg Oral Given 12/12/22 1706)  lactated ringers bolus 1,000 mL (0 mLs Intravenous Stopped 12/12/22 1816)  ondansetron (ZOFRAN) injection 4 mg (4 mg Intravenous Given 12/12/22 1615)  ketorolac (TORADOL) 15 MG/ML injection 15 mg (15 mg Intravenous Given 12/12/22 1815)  oxyCODONE (Oxy IR/ROXICODONE) immediate release tablet 5 mg (5 mg Oral Given 12/12/22 1815)    Clinical Impression:  1. Fall, initial encounter   2. Closed fracture of nasal bone, initial encounter      Discharge   Final Clinical Impression(s) / ED Diagnoses Final diagnoses:  Fall, initial encounter  Closed fracture of nasal bone, initial encounter    Rx / DC Orders ED Discharge Orders          Ordered    oxyCODONE (ROXICODONE) 5 MG immediate release tablet  Every 6 hours PRN  12/12/22 1749    celecoxib (CELEBREX) 200 MG capsule  2 times daily        12/12/22 1749    ondansetron (ZOFRAN) 4 MG tablet  Every 6 hours        12/12/22 1854              Glyn Ade, MD 12/12/22 1856

## 2022-12-12 NOTE — Discharge Instructions (Addendum)
You have multiple nasal fractures.  Your nasal bone, your sinus and your inferior orbital wall.  You do not have evidence of any nerve entrapment fortunately. You will need to follow-up with a otolaryngologist within the next 72 hours. Do not blow your nose or you are at risk of introducing air into the fracture Clinical Course as of 12/12/22 1753  Tue Dec 12, 2022  1723 2. Displaced fracture through the orbital floor on the right. The inferior rectus muscle body is not appear displaced. 3. Mildly displaced fracture through the anterior wall of the right maxillary sinus 4. Displaced nasal bone fracture on the right.   [CC]    Clinical Course User Index [CC] Glyn Ade, MD

## 2022-12-12 NOTE — ED Triage Notes (Signed)
Pt endorses mechanical fall, pulled down by large dog. Pt denies loc. Lac reports above RT eye, facial swelling below RT eye with abrasions, abrasion to RT shoulder, bleeding and skin tears to RT hand. Denies thinners. Last tetanus Unknown

## 2022-12-12 NOTE — ED Notes (Signed)
Back from CT, states, "movement on stretcher, and up and down movements made me feel worse, increased nausea", EDP notified, orders received.

## 2022-12-14 ENCOUNTER — Ambulatory Visit (INDEPENDENT_AMBULATORY_CARE_PROVIDER_SITE_OTHER): Payer: Federal, State, Local not specified - PPO | Admitting: Otolaryngology

## 2022-12-14 ENCOUNTER — Encounter (INDEPENDENT_AMBULATORY_CARE_PROVIDER_SITE_OTHER): Payer: Self-pay

## 2022-12-14 VITALS — Ht 66.0 in | Wt 185.0 lb

## 2022-12-14 DIAGNOSIS — W19XXXA Unspecified fall, initial encounter: Secondary | ICD-10-CM

## 2022-12-14 DIAGNOSIS — S0231XA Fracture of orbital floor, right side, initial encounter for closed fracture: Secondary | ICD-10-CM

## 2022-12-14 DIAGNOSIS — S0240CA Maxillary fracture, right side, initial encounter for closed fracture: Secondary | ICD-10-CM | POA: Diagnosis not present

## 2022-12-14 DIAGNOSIS — S022XXA Fracture of nasal bones, initial encounter for closed fracture: Secondary | ICD-10-CM | POA: Diagnosis not present

## 2022-12-16 DIAGNOSIS — S0231XA Fracture of orbital floor, right side, initial encounter for closed fracture: Secondary | ICD-10-CM | POA: Insufficient documentation

## 2022-12-16 DIAGNOSIS — S022XXA Fracture of nasal bones, initial encounter for closed fracture: Secondary | ICD-10-CM | POA: Insufficient documentation

## 2022-12-16 DIAGNOSIS — S0240CA Maxillary fracture, right side, initial encounter for closed fracture: Secondary | ICD-10-CM | POA: Insufficient documentation

## 2022-12-16 NOTE — Progress Notes (Signed)
Patient ID: Deanna Choi, female   DOB: Dec 20, 1960, 62 y.o.   MRN: [id number]  Cc: Right orbital, maxillary, and facial fractures  HPI:  Deanna Choi is a 62 y.o. female who presents today for evaluation of her facial fractures.  According to the patient, she had an accidental fall 2 days ago, hitting the right side of her face on the ground.  It resulted in significant right facial injuries.  She was evaluated at the [facility] health emergency room.  Her CT scan showed fractures of her right orbital floor, right maxillary wall, and right nasal bone.  The patient has significant right facial edema and ecchymosis.  However, she denies any visual change or nasal obstruction.  She has no obvious facial or nasal deformity.  Past Medical History:  Diagnosis Date   Allergy    Diverticulosis    gastric ulcers    Glaucoma    Headache    Hypertension    Hypomagnesemia    Obesity    Skin cancer     Past Surgical History:  Procedure Laterality Date   KNEE SURGERY     SALIVARY GLAND SURGERY     SQUAMOUS CELL CARCINOMA EXCISION     UPPER GASTROINTESTINAL ENDOSCOPY  06/2021   VAGINAL HYSTERECTOMY  2009    Family History  Problem Relation Age of Onset   Hypertension Mother    Colon polyps Mother    Hypertension Father    Stroke Father    Irritable bowel syndrome Father    Heart disease Brother        CABG- secondary to heart disease   Hypertension Other    Colon cancer Neg Hx    Esophageal cancer Neg Hx    Stomach cancer Neg Hx    Pancreatic cancer Neg Hx    Liver cancer Neg Hx    Rectal cancer Neg Hx     Social History:  reports that she has never smoked. She has never used smokeless tobacco. She reports that she does not currently use alcohol. She reports that she does not use drugs.  Allergies: No Known Allergies  Prior to Admission medications   Medication Sig Start Date End Date Taking? Authorizing Provider  celecoxib (CELEBREX) 200 MG capsule Take 1 capsule (200 mg  total) by mouth 2 (two) times daily. 12/12/22  Yes Glyn Ade, MD  loratadine (CLARITIN) 10 MG tablet Take 10 mg by mouth as needed. 12/06/15  Yes [provider]  ondansetron (ZOFRAN) 4 MG tablet Take 1 tablet (4 mg total) by mouth every 6 (six) hours. 12/12/22  Yes Countryman, Almeta Monas, MD  oxyCODONE (ROXICODONE) 5 MG immediate release tablet Take 1 tablet (5 mg total) by mouth every 6 (six) hours as needed for severe pain. 12/12/22  Yes Glyn Ade, MD  diltiazem (CARDIZEM) 30 MG tablet Take 1 tablet every 4 hours AS NEEDED for AFIB heart rate >100 as long as top BP >100. Patient not taking: Reported on 12/14/2022 04/13/22   Newman Nip, NP  omeprazole (PRILOSEC) 40 MG capsule Take 40 mg by mouth as needed. Patient not taking: Reported on 12/14/2022    [provider]   Height 5\' 6"  (1.676 m), weight 83.9 kg. Exam: General: Communicates without difficulty, well nourished, no acute distress. Face/sinus: Significant right facial edema and ecchymosis.  Facial movement is normal and symmetric. Eyes: PERRL, EOMI. No scleral icterus, conjunctivae clear. Neuro: CN II exam reveals vision grossly intact.  No nystagmus at any point of gaze.  Ears: Auricles well formed without lesions.  Ear canals are intact without mass or lesion.  No erythema or edema is appreciated.  The TMs are intact without fluid. Nose: External evaluation reveals normal support and skin without lesions.  No significant dorsal deformity.  Anterior rhinoscopy reveals congested mucosa over anterior aspect of inferior turbinates and intact septum.  No purulence noted. Oral:  Oral cavity and oropharynx are intact, symmetric, without erythema or edema.  Mucosa is moist without lesions. Neck: Full range of motion without pain.  There is no significant lymphadenopathy.  No masses palpable.  Thyroid bed within normal limits to palpation.  Parotid glands and submandibular glands equal bilaterally without mass.  Trachea is  midline. Neuro:  CN 2-12 grossly intact.    Assessment: 1.  Closed fractures of the right orbital floor, maxillary wall, and nasal bones.  No significant facial deformity is noted today. 2.  No entrapment or diplopia is noted today.  Her vision is grossly intact bilaterally.  Plan: 1.  The physical exam finding and the CT results are reviewed with the patient. 2.  Based on the above findings, the decision is made to proceed with conservative observation. 3.  Cold compresses for edema control. 4.  The patient is encouraged to call with any questions or concerns.  Elaine Middleton W Tyasia Packard 12/16/2022, 6:56 AM

## 2023-07-02 IMAGING — RF DG ESOPHAGUS
8 of 9 series · 14 of 24 positions shown · non-contrast
Comparison: NONE.

CLINICAL DATA: Patient with history of unspecified dysphagia with
solids. Patient presents for a double contrast esophagram with
tablet.

EXAM:
ESOPHAGUS/BARIUM SWALLOW/TABLET STUDY
TECHNIQUE: Combined double and single contrast examination was performed using
effervescent crystals, high-density barium, and thin liquid barium.
This exam was performed by Diamandis Calap., and was
supervised and interpreted by Dr. Dex Alberto.
FLUOROSCOPY:
Radiation Exposure Index (as provided by the fluoroscopic device):
10.3 mGy Kerma

[Series 2: cp_standard · 2 of 75 frames shown (1 of 5)]
[frame 12/75]
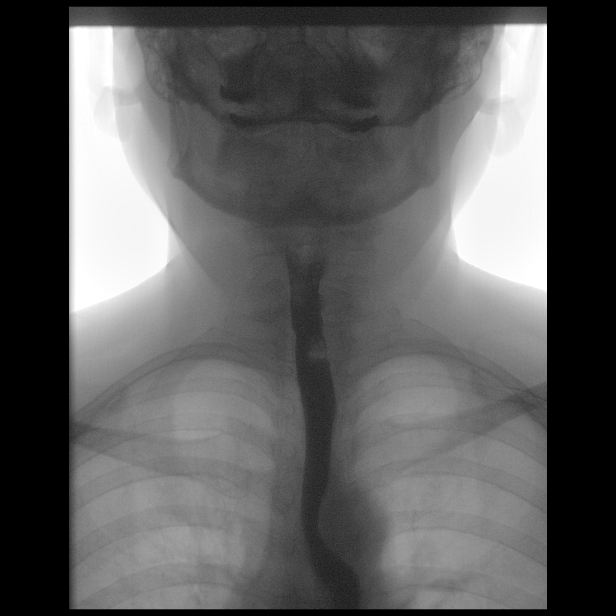
[frame 64/75]
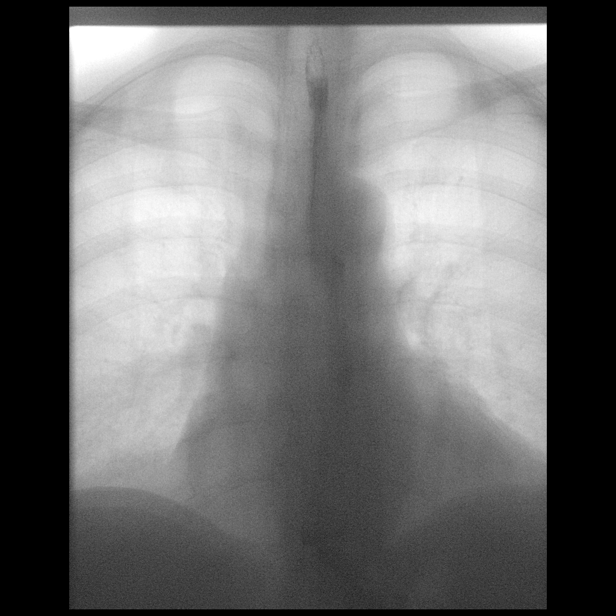

[Series 3: cp_standard · 2 of 80 frames shown (2 of 5)]
[frame 41/80]
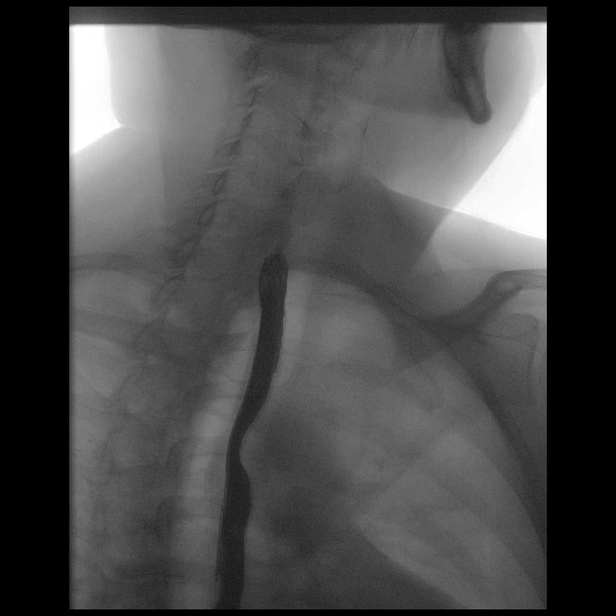
[frame 69/80]
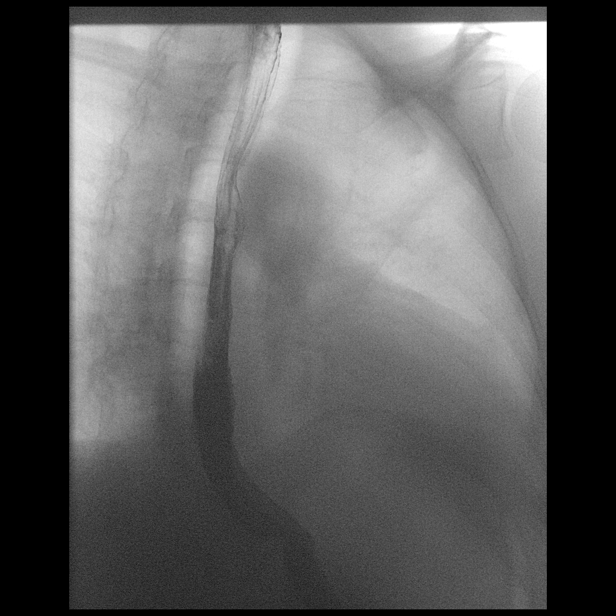

[Series 4: fluoro_barium 2fps_bw · 1 of 3 frames shown (1 of 3)]
[frame 2/3]
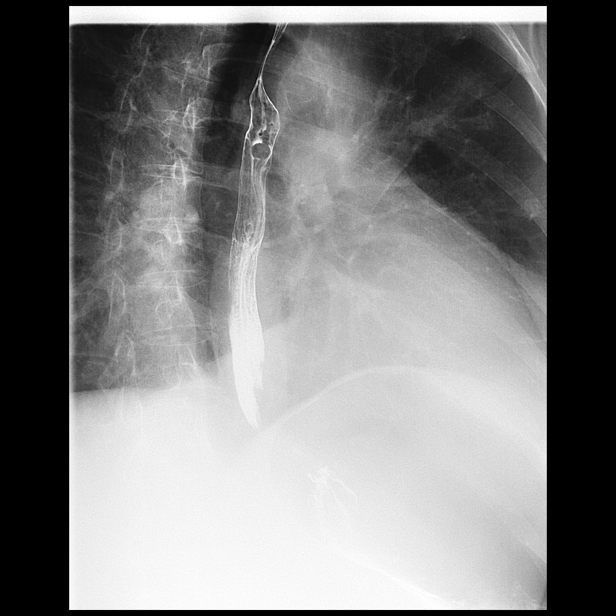

[Series 5: fluoro_barium 2fps_bw · 1 of 2 frames shown (2 of 3)]
[frame 1/2]
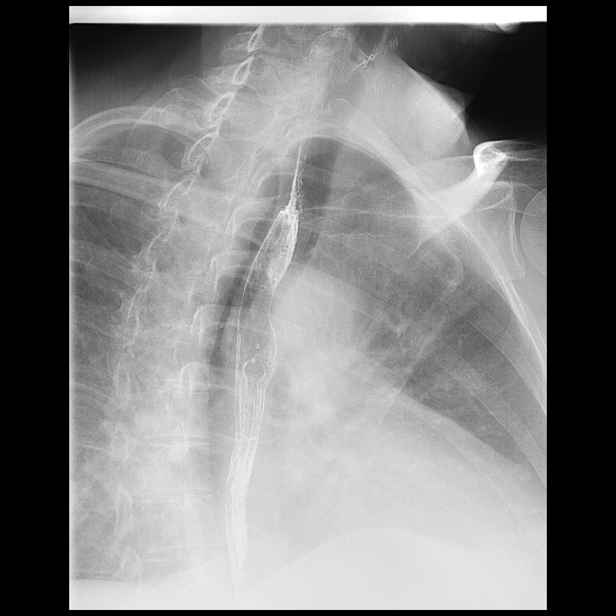

[Series 6: fluoro_barium 2fps_bw · 2 of 3 frames shown (3 of 3)]
[frame 1/3]
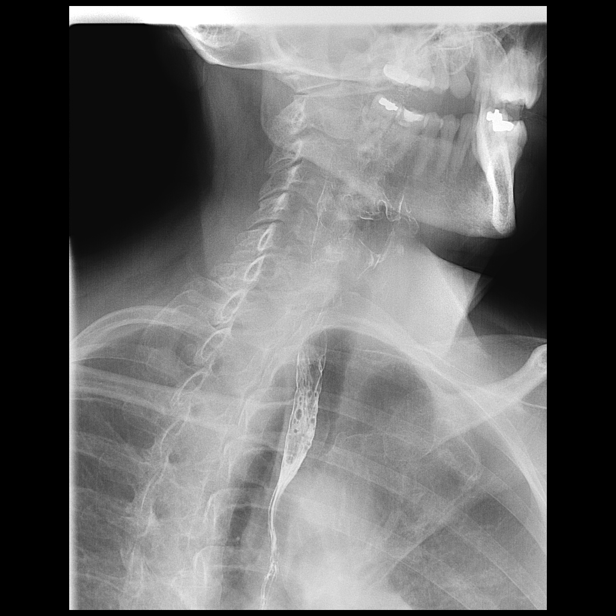
[frame 3/3]
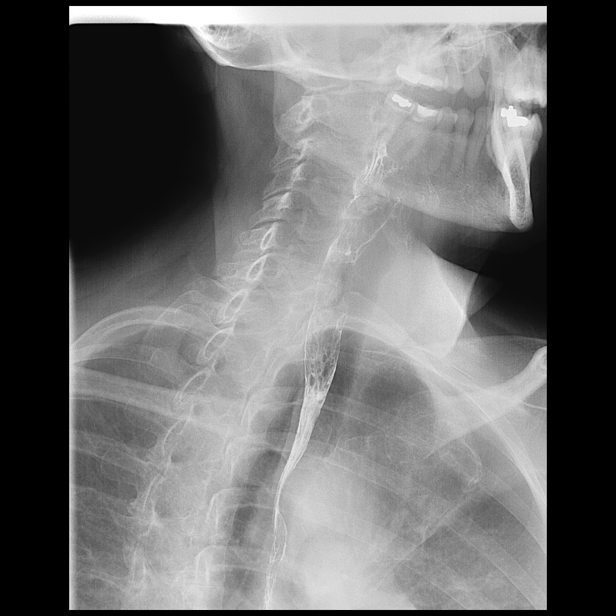

[Series 7: cp_standard · 2 of 73 frames shown (3 of 5)]
[frame 37/73]
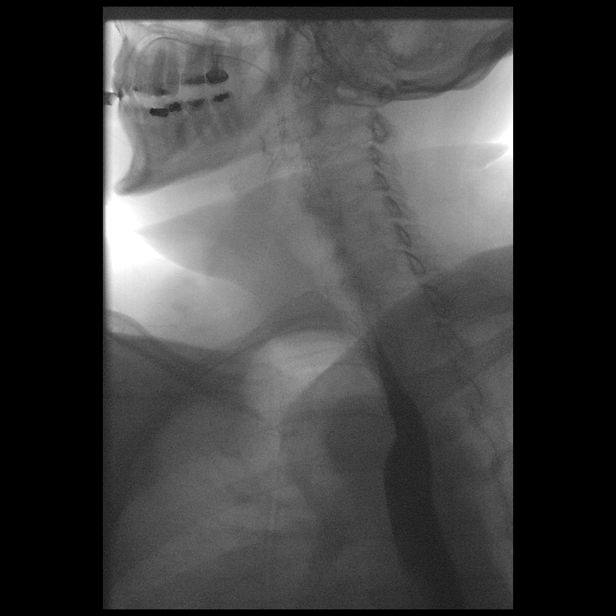
[frame 63/73]
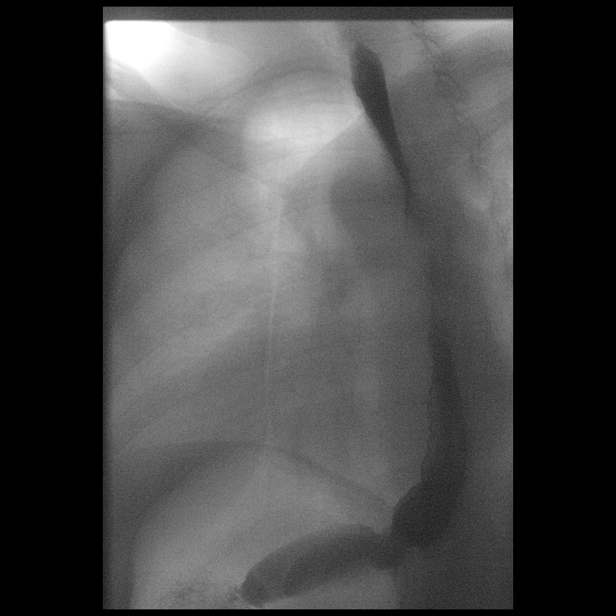

[Series 8: cp_standard · 2 of 184 frames shown (4 of 5)]
[frame 93/184]
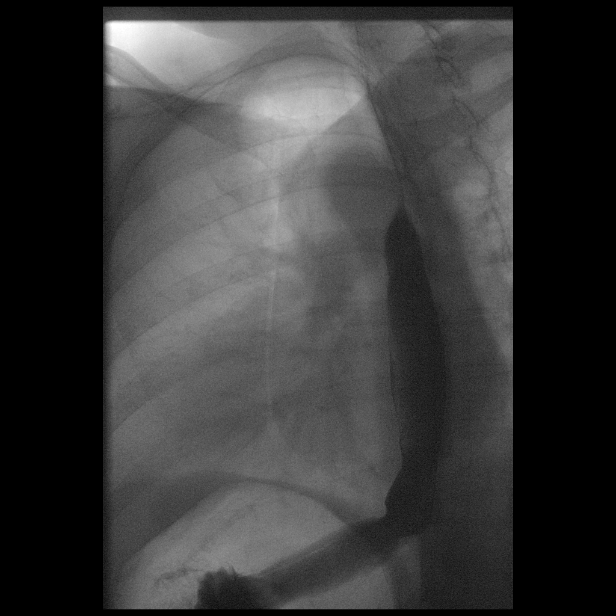
[frame 157/184]
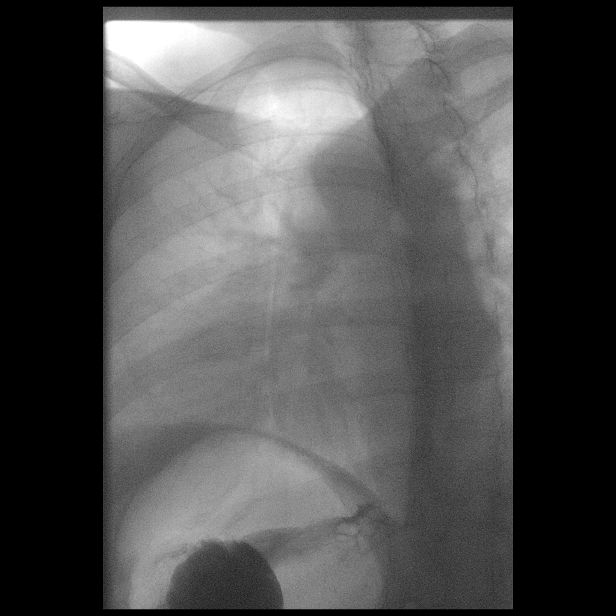

[Series 10: cp_standard · 2 of 46 frames shown (5 of 5)]
[frame 7/46]
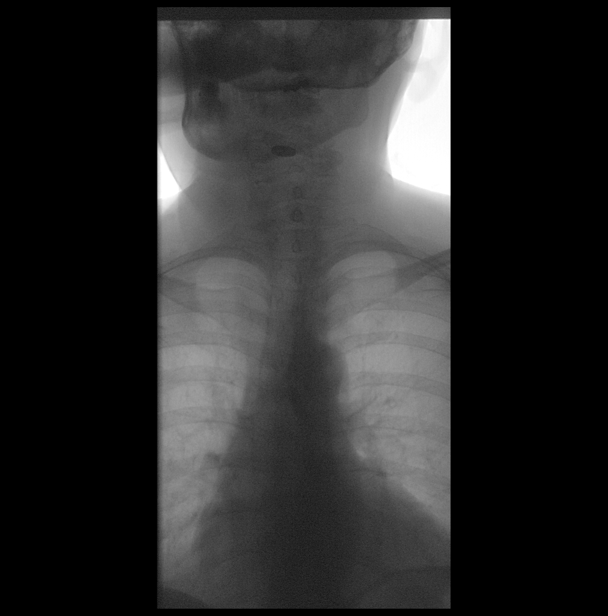
[frame 40/46]
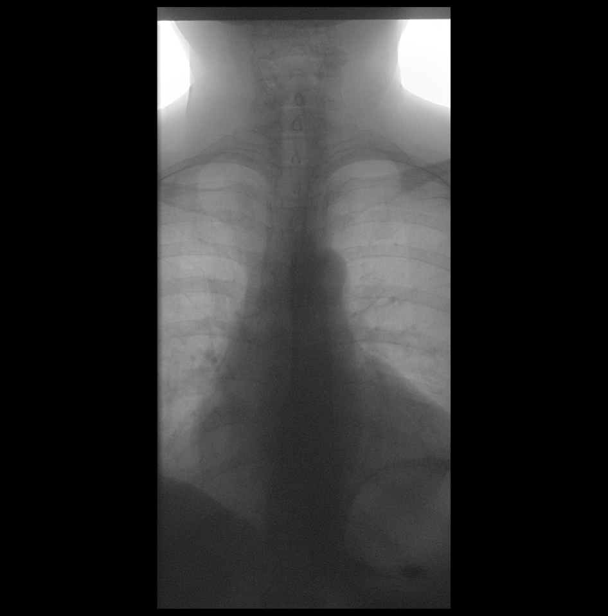

[14 of 24 positions shown; findings below may reference images not displayed]

FINDINGS: Swallowing: Appears normal. No vestibular penetration or aspiration
seen.

Pharynx: Unremarkable.

Esophagus: Normal appearance.

Esophageal motility: Within normal limits.

Hiatal Hernia: None.

Gastroesophageal reflux: None visualized.

Ingested 13mm barium tablet: Passed normally/Became stuck/Not given

Other: None.
IMPRESSION: Unremarkable esophagram.

## 2023-10-23 NOTE — Telephone Encounter (Signed)
 Pt is requesting a callback at [phone] from Brad to f/u on titration since last speaking with her. Please advise.

## 2023-10-31 NOTE — Telephone Encounter (Signed)
 Returned call: Spoke to patient and she is ready to move forward with her cpap titration. Will start the precert process.

## 2023-11-02 NOTE — Addendum Note (Signed)
**Note De-Identified Desarea Ohagan Obfuscation** Addended by: LAURENT AVELINA HERO on: 11/02/2023 11:19 AM   Modules accepted: Orders

## 2023-11-02 NOTE — Telephone Encounter (Signed)
**Note De-Identified Brittnae Aschenbrenner Obfuscation** I completed a [employer] Medical PA form and attached the pts Stop [place] questionnaire answers (had to send a printed copy as I could not addend the office visit note to answer Stop [place] questions in the pts chart),her 05/09/2022 office visit notes, HST results, and Dr Robie recommendation to have a CPAP Titration. I have faxed all to [employer] at [phone].

## 2023-11-02 NOTE — Telephone Encounter (Addendum)
**Note De-Identified Chrisann Melaragno Obfuscation** CC'd Chart Routing History  Routing History  From: Shlomo Wilbert SAUNDERS, MD On: 06/25/2022 07:01 PM  To: Cv Div Sleep Studies (Pool)  Priority: Routine  Routing Comments:  Please let patient know that they have sleep apnea.  Recommend therapeutic CPAP titration for treatment of patient's sleep disordered breathing.  If unable to perform an in lab titration then initiate ResMed auto CPAP from 4 to 15cm H2O with heated humidity and mask of choice and overnight pulse ox on CPAP.

## 2023-11-27 NOTE — Telephone Encounter (Signed)
**Note De-Identified Madden Garron Obfuscation** I called [employer] and s/w Lyndell who advised me that they have not received a CPAP Titration PA form fax from me. I gave her the fax number that I used to fax it to and she advised me that [phone] is not one of their fax numbers. [phone] is the fax number written at the bottom of the PA form.  Per Lyndell, I have faxed the PA form and all clinicals needed for this CPAP Titration PA to [phone] and I did receive confirmation that the fax sent successfully.

## 2023-12-07 NOTE — Telephone Encounter (Signed)
**Note De-Identified Jamayia Croker Obfuscation** Letter received from [employer] stating that they have approved this CPAP Titration from 12/24/2023-01/22/2024. Reference #: [id number]  I have transferred the order to the Sleep Lab.  I called the pt and made her aware of this approval and I gave her the Sleep Labs phone number so she can call them to be scheduled.  The pt verbalized understanding and thanked me for my call.

## 2024-01-10 ENCOUNTER — Ambulatory Visit (HOSPITAL_BASED_OUTPATIENT_CLINIC_OR_DEPARTMENT_OTHER): Attending: Cardiology | Admitting: Cardiology

## 2024-01-10 VITALS — Ht 66.0 in | Wt 185.0 lb

## 2024-01-10 DIAGNOSIS — R519 Headache, unspecified: Secondary | ICD-10-CM | POA: Diagnosis present

## 2024-01-10 DIAGNOSIS — I480 Paroxysmal atrial fibrillation: Secondary | ICD-10-CM | POA: Diagnosis present

## 2024-01-10 DIAGNOSIS — G4733 Obstructive sleep apnea (adult) (pediatric): Secondary | ICD-10-CM | POA: Diagnosis not present

## 2024-01-10 DIAGNOSIS — R002 Palpitations: Secondary | ICD-10-CM | POA: Diagnosis present

## 2024-01-10 DIAGNOSIS — I471 Supraventricular tachycardia, unspecified: Secondary | ICD-10-CM | POA: Insufficient documentation

## 2024-01-15 NOTE — Procedures (Signed)
 Indications for Polysomnography The patient is a 63 year-old Female who is 5' 6 and weighs 185.0 lbs. Her BMI equals 30.1.  A full night titration treatment study was performed.  MedicationNo Data. Polysomnogram Data A full night polysomnogram recorded the standard physiologic parameters including EEG, EOG, EMG, EKG, nasal and oral airflow.  Respiratory parameters of chest and abdominal movements were recorded with Respiratory Inductance Plethysmography belts.   Oxygen saturation was recorded by pulse oximetry.  Sleep Architecture The total recording time of the polysomnogram was 371.8 minutes.  The total sleep time was 247.0 minutes.  The patient spent 2.6% of total sleep time in Stage N1, 68.2% in Stage N2, 13.4% in Stages N3, and 15.8% in REM.  Sleep latency was 15.5 minutes.   REM latency was 149.5 minutes.  Sleep Efficiency was 66.4%.  Wake after Sleep Onset time was 109.5 minutes.  Titration Summary The patient was titrated at pressures ranging from 5 cm/H20 up to 14 cm/H20. The last pressure used in the study was 14 cm/H20.  Respiratory Events The polysomnogram revealed a presence of 0 obstructive, 1 central, and 0 mixed apneas resulting in an Apnea index of 0.2 events per hour.  There were 43 hypopneas (GreaterEqual to3% desaturation and/or arousal) resulting in an Apnea\Hypopnea Index (AHI  GreaterEqual to3% desaturation and/or arousal) of 10.7 events per hour.  There were 26 hypopneas (GreaterEqual to4% desaturation) resulting in an Apnea\Hypopnea Index (AHI GreaterEqual to4% desaturation) of 6.6 events per hour.  There were 0 Respiratory  Effort Related Arousals resulting in a RERA index of 0 events per hour. The Respiratory Disturbance Index is 10.7 events per hour.  The snore index was 41.8 events per hour.  Mean oxygen saturation was 94.9%.  The lowest oxygen saturation during sleep was 86.0%.  Time spent LessEqual to88% oxygen saturation was  minutes ().  Limb Activity There  were 135 limb movements recorded.  Of this total, 117 were classified as PLMs.  Of the PLMs, - were associated with arousals.  The Limb Movement index was 32.8 per hour while the PLM index was 28.4 per hour.  Cardiac Summary The average pulse rate was 55.6 bpm.  The minimum pulse rate was 42.0 bpm while the maximum pulse rate was 80.0 bpm.  Cardiac rhythm was NSR with a 13 beat run of SVT.  Diagnosis: Obstructive Sleep Apnea Successful CPAP Titration SVT      Recommendations: 1.  Recommend a trial of ResMed CPAP at 14cm H2O with heated humidity and medium Simplus Full Face mask. 2. Close follow-up is necessary to ensure success with CPAP or oral appliance therapy for maximum benefit. 3. A follow-up oximetry study on CPAP is recommended to assess the adequacy of therapy and determine the need for supplemental oxygen or the potential need for Bi-level therapy.  An arterial blood gas to determine the adequacy of baseline ventilation and  oxygenation should also be considered. 4. Healthy sleep recommendations include:  adequate nightly sleep (normal 7-9 hrs/night), avoidance of caffeine after noon and alcohol near bedtime, and maintaining a sleep environment that is cool, dark and quiet. 5. Weight loss for overweight patients is recommended.  Even modest amounts of weight loss can significantly improve the severity of sleep apnea. 6. Snoring recommendations include:  weight loss where appropriate, side sleeping, and avoidance of alcohol before bed. 7. Operation of motor vehicle should be avoided when sleepy.    This study was personally reviewed and electronically signed by: WILBERT BIHARI MD Accredited Board Certified in  Sleep Medicine Date/Time: 01/14/2024 1:08PM

## 2024-01-23 ENCOUNTER — Telehealth: Payer: Self-pay | Admitting: *Deleted

## 2024-01-23 NOTE — Telephone Encounter (Signed)
-----   Message from Deanna Choi sent at 01/15/2024  1:09 PM EST ----- Please let patient know that they had a successful PAP titration and let DME know that orders are in EPIC.  Please set up 6 week OV with me.

## 2024-01-23 NOTE — Telephone Encounter (Signed)
 The patient has been notified of the result and verbalized understanding.  All questions (if any) were answered. Joshua Dalton Seip, CMA 01/23/2024 5:32 PM    Upon patient request DME selection is [facility] Patient understands he will be contacted by [facility] to set up his cpap. Patient understands to call if [facility] does not contact him with new setup in a timely manner. Patient understands they will be called once confirmation has been received from [facility] that they have received their new machine to schedule 10 week follow up appointment.   [facility] notified of new cpap order  Please add to airview Patient was grateful for the call and thanked me.

## 2024-03-20 ENCOUNTER — Ambulatory Visit: Payer: Self-pay | Admitting: Cardiology

## 2024-03-20 DIAGNOSIS — G4733 Obstructive sleep apnea (adult) (pediatric): Secondary | ICD-10-CM

## 2024-03-20 DIAGNOSIS — I4891 Unspecified atrial fibrillation: Secondary | ICD-10-CM

## 2024-03-20 DIAGNOSIS — R002 Palpitations: Secondary | ICD-10-CM

## 2024-04-10 ENCOUNTER — Telehealth: Payer: Self-pay

## 2024-04-10 NOTE — Telephone Encounter (Signed)
 SABRA

## 2024-04-11 NOTE — Telephone Encounter (Signed)
 Per dr Shlomo, Decrease CPAP to 12cm H2O and get a download in 4 weeks. Please have patient let us  know if bloating does not improve.    Order placed to [facility] via community message.

## 2024-04-16 ENCOUNTER — Ambulatory Visit: Admitting: Cardiology
